# Patient Record
Sex: Male | Born: 1960
Health system: Southern US, Community
[De-identification: ages and names within clinical notes are randomized; demographics above are authoritative.]

## PROBLEM LIST (undated history)

## (undated) DIAGNOSIS — E119 Type 2 diabetes mellitus without complications: Secondary | ICD-10-CM

## (undated) DIAGNOSIS — F1020 Alcohol dependence, uncomplicated: Secondary | ICD-10-CM

## (undated) DIAGNOSIS — I10 Essential (primary) hypertension: Secondary | ICD-10-CM

## (undated) DIAGNOSIS — E785 Hyperlipidemia, unspecified: Secondary | ICD-10-CM

## (undated) HISTORY — DX: Hyperlipidemia, unspecified: E78.5

## (undated) HISTORY — PX: APPENDECTOMY: SHX54

## (undated) HISTORY — DX: Essential (primary) hypertension: I10

---

## 2014-03-14 ENCOUNTER — Encounter: Payer: Self-pay | Admitting: Family

## 2014-04-12 ENCOUNTER — Encounter: Payer: Self-pay | Admitting: Family

## 2014-08-23 ENCOUNTER — Encounter: Payer: Self-pay | Admitting: Family Medicine

## 2014-11-14 ENCOUNTER — Telehealth: Payer: Self-pay | Admitting: Family Medicine

## 2014-11-15 NOTE — Telephone Encounter (Signed)
Patient has bcbs anthem and currently is not taking any medications. Appointment scheduled for 4/7 with Jannifer Rodneyhristy Hawks, FNP and he is aware that he needs to arrive 15 minutes before appointment and to bring insurance card.

## 2014-12-25 ENCOUNTER — Telehealth: Payer: Self-pay | Admitting: Family

## 2014-12-25 NOTE — Telephone Encounter (Signed)
Detailed message left that the reason for our call was to remind patient of his appointment.

## 2014-12-28 ENCOUNTER — Ambulatory Visit (INDEPENDENT_AMBULATORY_CARE_PROVIDER_SITE_OTHER): Payer: BLUE CROSS/BLUE SHIELD | Admitting: Family

## 2014-12-28 ENCOUNTER — Encounter: Payer: Self-pay | Admitting: Family

## 2014-12-28 VITALS — BP 142/83 | HR 100 | Temp 99.0°F | Ht 66.0 in | Wt 231.2 lb

## 2014-12-28 DIAGNOSIS — Z0000 Encounter for general adult medical examination without abnormal findings: Secondary | ICD-10-CM | POA: Diagnosis not present

## 2014-12-28 DIAGNOSIS — Z1211 Encounter for screening for malignant neoplasm of colon: Secondary | ICD-10-CM

## 2014-12-28 DIAGNOSIS — I10 Essential (primary) hypertension: Secondary | ICD-10-CM

## 2014-12-28 MED ORDER — METOPROLOL SUCCINATE ER 50 MG PO TB24: 50.0000 mg | ORAL_TABLET | Freq: Every day | ORAL | Status: DC

## 2014-12-28 NOTE — Patient Instructions (Signed)
Hypertension Hypertension, commonly called high blood pressure, is when the force of blood pumping through your arteries is too strong. Your arteries are the blood vessels that carry blood from your heart throughout your body. A blood pressure reading consists of a higher number over a lower number, such as 110/72. The higher number (systolic) is the pressure inside your arteries when your heart pumps. The lower number (diastolic) is the pressure inside your arteries when your heart relaxes. Ideally you want your blood pressure below 120/80. Hypertension forces your heart to work harder to pump blood. Your arteries may become narrow or stiff. Having hypertension puts you at risk for heart disease, stroke, and other problems.  RISK FACTORS Some risk factors for high blood pressure are controllable. Others are not.  Risk factors you cannot control include:   Race. You may be at higher risk if you are African American.  Age. Risk increases with age.  Gender. Men are at higher risk than women before age 45 years. After age 65, women are at higher risk than men. Risk factors you can control include:  Not getting enough exercise or physical activity.  Being overweight.  Getting too much fat, sugar, calories, or salt in your diet.  Drinking too much alcohol. SIGNS AND SYMPTOMS Hypertension does not usually cause signs or symptoms. Extremely high blood pressure (hypertensive crisis) may cause headache, anxiety, shortness of breath, and nosebleed. DIAGNOSIS  To check if you have hypertension, your health care provider will measure your blood pressure while you are seated, with your arm held at the level of your heart. It should be measured at least twice using the same arm. Certain conditions can cause a difference in blood pressure between your right and left arms. A blood pressure reading that is higher than normal on one occasion does not mean that you need treatment. If one blood pressure reading  is high, ask your health care provider about having it checked again. TREATMENT  Treating high blood pressure includes making lifestyle changes and possibly taking medicine. Living a healthy lifestyle can help lower high blood pressure. You may need to change some of your habits. Lifestyle changes may include:  Following the DASH diet. This diet is high in fruits, vegetables, and whole grains. It is low in salt, red meat, and added sugars.  Getting at least 2 hours of brisk physical activity every week.  Losing weight if necessary.  Not smoking.  Limiting alcoholic beverages.  Learning ways to reduce stress. If lifestyle changes are not enough to get your blood pressure under control, your health care provider may prescribe medicine. You may need to take more than one. Work closely with your health care provider to understand the risks and benefits. HOME CARE INSTRUCTIONS  Have your blood pressure rechecked as directed by your health care provider.   Take medicines only as directed by your health care provider. Follow the directions carefully. Blood pressure medicines must be taken as prescribed. The medicine does not work as well when you skip doses. Skipping doses also puts you at risk for problems.   Do not smoke.   Monitor your blood pressure at home as directed by your health care provider. SEEK MEDICAL CARE IF:   You think you are having a reaction to medicines taken.  You have recurrent headaches or feel dizzy.  You have swelling in your ankles.  You have trouble with your vision. SEEK IMMEDIATE MEDICAL CARE IF:  You develop a severe headache or confusion.    You have unusual weakness, numbness, or feel faint.  You have severe chest or abdominal pain.  You vomit repeatedly.  You have trouble breathing. MAKE SURE YOU:   Understand these instructions.  Will watch your condition.  Will get help right away if you are not doing well or get worse. Document  Released: 09/08/2005 Document Revised: 01/23/2014 Document Reviewed: 07/01/2013 [employer] Patient Information 2015 [employer]. This information is not intended to replace advice given to you by your health care provider. Make sure you discuss any questions you have with your health care provider. DASH Eating Plan DASH stands for "Dietary Approaches to Stop Hypertension." The DASH eating plan is a healthy eating plan that has been shown to reduce high blood pressure (hypertension). Additional health benefits may include reducing the risk of type 2 diabetes mellitus, heart disease, and stroke. The DASH eating plan may also help with weight loss. WHAT DO I NEED TO KNOW ABOUT THE DASH EATING PLAN? For the DASH eating plan, you will follow these general guidelines:  Choose foods with a percent daily value for sodium of less than 5% (as listed on the food label).  Use salt-free seasonings or herbs instead of table salt or sea salt.  Check with your health care provider or pharmacist before using salt substitutes.  Eat lower-sodium products, often labeled as "lower sodium" or "no salt added."  Eat fresh foods.  Eat more vegetables, fruits, and low-fat dairy products.  Choose whole grains. Look for the word "whole" as the first word in the ingredient list.  Choose fish and skinless chicken or turkey more often than red meat. Limit fish, poultry, and meat to 6 oz (170 g) each day.  Limit sweets, desserts, sugars, and sugary drinks.  Choose heart-healthy fats.  Limit cheese to 1 oz (28 g) per day.  Eat more home-cooked food and less restaurant, buffet, and fast food.  Limit fried foods.  Cook foods using methods other than frying.  Limit canned vegetables. If you do use them, rinse them well to decrease the sodium.  When eating at a restaurant, ask that your food be prepared with less salt, or no salt if possible. WHAT FOODS CAN I EAT? Seek help from a dietitian for individual  calorie needs. Grains Whole grain or whole wheat bread. Brown rice. Whole grain or whole wheat pasta. Quinoa, bulgur, and whole grain cereals. Low-sodium cereals. Corn or whole wheat flour tortillas. Whole grain cornbread. Whole grain crackers. Low-sodium crackers. Vegetables Fresh or frozen vegetables (raw, steamed, roasted, or grilled). Low-sodium or reduced-sodium tomato and vegetable juices. Low-sodium or reduced-sodium tomato sauce and paste. Low-sodium or reduced-sodium canned vegetables.  Fruits All fresh, canned (in natural juice), or frozen fruits. Meat and Other Protein Products Ground beef (85% or leaner), grass-fed beef, or beef trimmed of fat. Skinless chicken or turkey. Ground chicken or turkey. Pork trimmed of fat. All fish and seafood. Eggs. Dried beans, peas, or lentils. Unsalted nuts and seeds. Unsalted canned beans. Dairy Low-fat dairy products, such as skim or 1% milk, 2% or reduced-fat cheeses, low-fat ricotta or cottage cheese, or plain low-fat yogurt. Low-sodium or reduced-sodium cheeses. Fats and Oils Tub margarines without trans fats. Light or reduced-fat mayonnaise and salad dressings (reduced sodium). Avocado. Safflower, olive, or canola oils. Natural peanut or almond butter. Other Unsalted popcorn and pretzels. The items listed above may not be a complete list of recommended foods or beverages. Contact your dietitian for more options. WHAT FOODS ARE NOT RECOMMENDED? Grains White bread.   White pasta. White rice. Refined cornbread. Bagels and croissants. Crackers that contain trans fat. Vegetables Creamed or fried vegetables. Vegetables in a cheese sauce. Regular canned vegetables. Regular canned tomato sauce and paste. Regular tomato and vegetable juices. Fruits Dried fruits. Canned fruit in light or heavy syrup. Fruit juice. Meat and Other Protein Products Fatty cuts of meat. Ribs, chicken wings, bacon, sausage, bologna, salami, chitterlings, fatback, hot dogs,  bratwurst, and packaged luncheon meats. Salted nuts and seeds. Canned beans with salt. Dairy Whole or 2% milk, cream, half-and-half, and cream cheese. Whole-fat or sweetened yogurt. Full-fat cheeses or blue cheese. Nondairy creamers and whipped toppings. Processed cheese, cheese spreads, or cheese curds. Condiments Onion and garlic salt, seasoned salt, table salt, and sea salt. Canned and packaged gravies. Worcestershire sauce. Tartar sauce. Barbecue sauce. Teriyaki sauce. Soy sauce, including reduced sodium. Steak sauce. Fish sauce. Oyster sauce. Cocktail sauce. Horseradish. Ketchup and mustard. Meat flavorings and tenderizers. Bouillon cubes. Hot sauce. Tabasco sauce. Marinades. Taco seasonings. Relishes. Fats and Oils Butter, stick margarine, lard, shortening, ghee, and bacon fat. Coconut, palm kernel, or palm oils. Regular salad dressings. Other Pickles and olives. Salted popcorn and pretzels. The items listed above may not be a complete list of foods and beverages to avoid. Contact your dietitian for more information. WHERE CAN I FIND MORE INFORMATION? National Heart, Lung, and Blood Institute: [employer] Document Released: 08/28/2011 Document Revised: 01/23/2014 Document Reviewed: 07/13/2013 [facility] Patient Information 2015 [place], [place]. This information is not intended to replace advice given to you by your health care provider. Make sure you discuss any questions you have with your health care provider. Health Maintenance A healthy lifestyle and preventative care can promote health and wellness.  Maintain regular health, dental, and eye exams.  Eat a healthy diet. Foods like vegetables, fruits, whole grains, low-fat dairy products, and lean protein foods contain the nutrients you need and are low in calories. Decrease your intake of foods high in solid fats, added sugars, and salt. Get information about a proper diet from your health care  provider, if necessary.  Regular physical exercise is one of the most important things you can do for your health. Most adults should get at least 150 minutes of moderate-intensity exercise (any activity that increases your heart rate and causes you to sweat) each week. In addition, most adults need muscle-strengthening exercises on 2 or more days a week.   Maintain a healthy weight. The body mass index (BMI) is a screening tool to identify possible weight problems. It provides an estimate of body fat based on height and weight. Your health care provider can find your BMI and can help you achieve or maintain a healthy weight. For males 20 years and older:  A BMI below 18.5 is considered underweight.  A BMI of 18.5 to 24.9 is normal.  A BMI of 25 to 29.9 is considered overweight.  A BMI of 30 and above is considered obese.  Maintain normal blood lipids and cholesterol by exercising and minimizing your intake of saturated fat. Eat a balanced diet with plenty of fruits and vegetables. Blood tests for lipids and cholesterol should begin at age 66 and be repeated every 5 years. If your lipid or cholesterol levels are high, you are over age 67, or you are at high risk for heart disease, you may need your cholesterol levels checked more frequently.Ongoing high lipid and cholesterol levels should be treated with medicines if diet and exercise are not working.  If you smoke, find out  from your health care provider how to quit. If you do not use tobacco, do not start.  Lung cancer screening is recommended for adults aged 55-80 years who are at high risk for developing lung cancer because of a history of smoking. A yearly low-dose CT scan of the lungs is recommended for people who have at least a 30-pack-year history of smoking and are current smokers or have quit within the past 15 years. A pack year of smoking is smoking an average of 1 pack of cigarettes a day for 1 year (for example, a 30-pack-year  history of smoking could mean smoking 1 pack a day for 30 years or 2 packs a day for 15 years). Yearly screening should continue until the smoker has stopped smoking for at least 15 years. Yearly screening should be stopped for people who develop a health problem that would prevent them from having lung cancer treatment.  If you choose to drink alcohol, do not have more than 2 drinks per day. One drink is considered to be 12 oz (360 mL) of beer, 5 oz (150 mL) of wine, or 1.5 oz (45 mL) of liquor.  Avoid the use of street drugs. Do not share needles with anyone. Ask for help if you need support or instructions about stopping the use of drugs.  High blood pressure causes heart disease and increases the risk of stroke. Blood pressure should be checked at least every 1-2 years. Ongoing high blood pressure should be treated with medicines if weight loss and exercise are not effective.  If you are 4545-55 years old, ask your health care provider if you should take aspirin to prevent heart disease.  Diabetes screening involves taking a blood sample to check your fasting blood sugar level. This should be done once every 3 years after age 55 if you are at a normal weight and without risk factors for diabetes. Testing should be considered at a younger age or be carried out more frequently if you are overweight and have at least 1 risk factor for diabetes.  Colorectal cancer can be detected and often prevented. Most routine colorectal cancer screening begins at the age of 55 and continues through age 55. However, your health care provider may recommend screening at an earlier age if you have risk factors for colon cancer. On a yearly basis, your health care provider may provide home test kits to check for hidden blood in the stool. A small camera at the end of a tube may be used to directly examine the colon (sigmoidoscopy or colonoscopy) to detect the earliest forms of colorectal cancer. Talk to your health care  provider about this at age 55 when routine screening begins. A direct exam of the colon should be repeated every 5-10 years through age 55, unless early forms of precancerous polyps or small growths are found.  People who are at an increased risk for hepatitis B should be screened for this virus. You are considered at high risk for hepatitis B if:  You were born in a country where hepatitis B occurs often. Talk with your health care provider about which countries are considered high risk.  Your parents were born in a high-risk country and you have not received a shot to protect against hepatitis B (hepatitis B vaccine).  You have HIV or AIDS.  You use needles to inject street drugs.  You live with, or have sex with, someone who has hepatitis B.  You are a man who has sex  with other men (MSM).  You get hemodialysis treatment.  You take certain medicines for conditions like cancer, organ transplantation, and autoimmune conditions.  Hepatitis C blood testing is recommended for all people born from [birth year] through 1966 and any individual with known risk factors for hepatitis C.  Healthy men should no longer receive prostate-specific antigen (PSA) blood tests as part of routine cancer screening. Talk to your health care provider about prostate cancer screening.  Testicular cancer screening is not recommended for adolescents or adult males who have no symptoms. Screening includes self-exam, a health care provider exam, and other screening tests. Consult with your health care provider about any symptoms you have or any concerns you have about testicular cancer.  Practice safe sex. Use condoms and avoid high-risk sexual practices to reduce the spread of sexually transmitted infections (STIs).  You should be screened for STIs, including gonorrhea and chlamydia if:  You are sexually active and are younger than 24 years.  You are older than 24 years, and your health care provider tells you that you  are at risk for this type of infection.  Your sexual activity has changed since you were last screened, and you are at an increased risk for chlamydia or gonorrhea. Ask your health care provider if you are at risk.  If you are at risk of being infected with HIV, it is recommended that you take a prescription medicine daily to prevent HIV infection. This is called pre-exposure prophylaxis (PrEP). You are considered at risk if:  You are a man who has sex with other men (MSM).  You are a heterosexual man who is sexually active with multiple partners.  You take drugs by injection.  You are sexually active with a partner who has HIV.  Talk with your health care provider about whether you are at high risk of being infected with HIV. If you choose to begin PrEP, you should first be tested for HIV. You should then be tested every 3 months for as long as you are taking PrEP.  Use sunscreen. Apply sunscreen liberally and repeatedly throughout the day. You should seek shade when your shadow is shorter than you. Protect yourself by wearing long sleeves, pants, a wide-brimmed hat, and sunglasses year round whenever you are outdoors.  Tell your health care provider of new moles or changes in moles, especially if there is a change in shape or color. Also, tell your health care provider if a mole is larger than the size of a pencil eraser.  A one-time screening for abdominal aortic aneurysm (AAA) and surgical repair of large AAAs by ultrasound is recommended for men aged 65-75 years who are current or former smokers.  Stay current with your vaccines (immunizations). Document Released: 03/06/2008 Document Revised: 09/13/2013 Document Reviewed: 02/03/2011 [facility] Patient Information 2015 [place], [place]. This information is not intended to replace advice given to you by your health care provider. Make sure you discuss any questions you have with your health care provider.

## 2014-12-28 NOTE — Progress Notes (Signed)
   Subjective:    Patient ID: Austin Rush, male    DOB: 01/07/1961, 54 y.o.   MRN: [id number]    HPI Pt presents to the office to establish care. Pt currently not taking any medications at this time. Pt denies any headache, palpitations, SOB, or edema at this time.  Pt's BP is elevated today. Pt states it usually is in the 150's/80's.   Review of Systems  Constitutional: Negative.   HENT: Negative.   Respiratory: Negative.   Cardiovascular: Negative.   Gastrointestinal: Negative.   Endocrine: Negative.   Genitourinary: Negative.   Musculoskeletal: Negative.   Neurological: Negative.   Hematological: Negative.   Psychiatric/Behavioral: Negative.   All other systems reviewed and are negative.      Objective:   Physical Exam  Constitutional: He is oriented to person, place, and time. He appears well-developed and well-nourished. No distress.  HENT:  Head: Normocephalic.  Right Ear: External ear normal.  Left Ear: External ear normal.  Nose: Nose normal.  Mouth/Throat: Oropharynx is clear and moist.  Eyes: Pupils are equal, round, and reactive to light. Right eye exhibits no discharge. Left eye exhibits no discharge.  Neck: Normal range of motion. Neck supple. No thyromegaly present.  Cardiovascular: Normal rate, regular rhythm, normal heart sounds and intact distal pulses.   No murmur heard. Pulmonary/Chest: Effort normal and breath sounds normal. No respiratory distress. He has no wheezes.  Abdominal: Soft. Bowel sounds are normal. He exhibits no distension. There is no tenderness.  Musculoskeletal: Normal range of motion. He exhibits no edema or tenderness.  Neurological: He is alert and oriented to person, place, and time. He has normal reflexes. No cranial nerve deficit.  Skin: Skin is warm and dry. No rash noted. No erythema.  Psychiatric: He has a normal mood and affect. His behavior is normal. Judgment and thought content normal.  Vitals reviewed.   BP 142/83 mmHg   Pulse 100  Temp(Src) 99 F (37.2 C) (Oral)  Ht $R'5\' 6"'fD$  (1.676 m)  Wt 231 lb 3.2 oz (104.872 kg)  BMI 37.33 kg/m2 '      Assessment & Plan:  1. Essential hypertension -I started on  Metoprolol 50 mg today -Dash diet information given -Exercise encouraged - Stress Management  -Continue current meds -RTO in 2 weeks  - CMP14+EGFR - metoprolol succinate (TOPROL-XL) 50 MG 24 hr tablet; Take 1 tablet (50 mg total) by mouth daily. Take with or immediately following a meal.  Dispense: 90 tablet; Refill: 3  2. Annual physical exam - CMP14+EGFR - PSA, total and free - Thyroid Panel With TSH - Vit D  25 hydroxy (rtn osteoporosis monitoring)  3. Encounter for screening colonoscopy - Ambulatory referral to Gastroenterology   Continue all meds Labs pending Health Maintenance reviewed Diet and exercise encouraged RTO 2 weeks   Evelina Dun, FNP

## 2014-12-29 ENCOUNTER — Telehealth: Payer: Self-pay | Admitting: *Deleted

## 2014-12-29 ENCOUNTER — Other Ambulatory Visit: Payer: Self-pay | Admitting: Family

## 2014-12-29 DIAGNOSIS — E559 Vitamin D deficiency, unspecified: Secondary | ICD-10-CM

## 2014-12-29 DIAGNOSIS — E1159 Type 2 diabetes mellitus with other circulatory complications: Secondary | ICD-10-CM | POA: Insufficient documentation

## 2014-12-29 DIAGNOSIS — I10 Essential (primary) hypertension: Secondary | ICD-10-CM

## 2014-12-29 DIAGNOSIS — I152 Hypertension secondary to endocrine disorders: Secondary | ICD-10-CM | POA: Insufficient documentation

## 2014-12-29 LAB — CMP14+EGFR
A/G RATIO: 1.5 (ref 1.1–2.5)
ALBUMIN: 4.2 g/dL (ref 3.5–5.5)
ALK PHOS: 67 IU/L (ref 39–117)
ALT: 60 IU/L — ABNORMAL HIGH (ref 0–44)
AST: 33 IU/L (ref 0–40)
BILIRUBIN TOTAL: 0.5 mg/dL (ref 0.0–1.2)
BUN / CREAT RATIO: 10 (ref 9–20)
BUN: 10 mg/dL (ref 6–24)
CO2: 22 mmol/L (ref 18–29)
Calcium: 9.9 mg/dL (ref 8.7–10.2)
Chloride: 101 mmol/L (ref 97–108)
Creatinine, Ser: 1.05 mg/dL (ref 0.76–1.27)
GFR calc Af Amer: 92 mL/min/{1.73_m2} (ref >59)
GFR calc non Af Amer: 80 mL/min/{1.73_m2} (ref >59)
GLOBULIN, TOTAL: 2.8 g/dL (ref 1.5–4.5)
Glucose: 98 mg/dL (ref 65–99)
Potassium: 4.6 mmol/L (ref 3.5–5.2)
Sodium: 139 mmol/L (ref 134–144)
Total Protein: 7 g/dL (ref 6.0–8.5)

## 2014-12-29 LAB — PSA, TOTAL AND FREE
PSA FREE: 0.31 ng/mL
PSA, Free Pct: 28.2 %
PSA: 1.1 ng/mL (ref 0.0–4.0)

## 2014-12-29 LAB — THYROID PANEL WITH TSH
FREE THYROXINE INDEX: 2 (ref 1.2–4.9)
T3 Uptake Ratio: 28 % (ref 24–39)
T4 TOTAL: 7 ug/dL (ref 4.5–12.0)
TSH: 0.472 u[IU]/mL (ref 0.450–4.500)

## 2014-12-29 LAB — VITAMIN D 25 HYDROXY (VIT D DEFICIENCY, FRACTURES): Vit D, 25-Hydroxy: 22.3 ng/mL — ABNORMAL LOW (ref 30.0–100.0)

## 2014-12-29 MED ORDER — VITAMIN D (ERGOCALCIFEROL) 1.25 MG (50000 UNIT) PO CAPS: 50000.0000 [IU] | ORAL_CAPSULE | ORAL | Status: DC

## 2014-12-29 NOTE — Telephone Encounter (Signed)
-----   Message from Junie Spencerhristy A Hawks, FNP sent at 12/29/2014  9:01 AM EDT ----- Kidney and liver function stable- Liver enzyme slightly elevated- Limit alcohol and tylenol intake Thyroid levels WNL PSA levels WNL Vit D levels low- Prescription sent to pharmacy

## 2014-12-29 NOTE — Progress Notes (Signed)
Lmtcb/ww 4/8 

## 2015-01-30 ENCOUNTER — Telehealth: Payer: Self-pay | Admitting: Family

## 2015-01-30 NOTE — Telephone Encounter (Signed)
Detailed message left that patient was due to be seen 2 weeks after last visit so he is due to be seen now.

## 2015-03-06 ENCOUNTER — Encounter (INDEPENDENT_AMBULATORY_CARE_PROVIDER_SITE_OTHER): Payer: Self-pay

## 2015-03-06 ENCOUNTER — Encounter: Payer: Self-pay | Admitting: Family

## 2015-03-06 ENCOUNTER — Ambulatory Visit (INDEPENDENT_AMBULATORY_CARE_PROVIDER_SITE_OTHER): Payer: BLUE CROSS/BLUE SHIELD | Admitting: Family

## 2015-03-06 VITALS — BP 152/91 | HR 82 | Temp 99.1°F

## 2015-03-06 DIAGNOSIS — I10 Essential (primary) hypertension: Secondary | ICD-10-CM | POA: Diagnosis not present

## 2015-03-06 MED ORDER — HYDROCHLOROTHIAZIDE 12.5 MG PO TABS: 12.5000 mg | ORAL_TABLET | Freq: Every day | ORAL | Status: DC

## 2015-03-06 NOTE — Progress Notes (Signed)
   Subjective:    Patient ID: Austin Rush, male    DOB: Nov 17, 1960, 54 y.o.   MRN: [id number]  Pt presents to the office today to recheck HTN. Pt's BP is not at goal today. Pt states the "medication was too strong and made me sleepy so I stopped taking it". Pt was placed on metoprolol 50 mg daily, but currently not taking any medication at this time.  Hypertension This is a recurrent problem. The current episode started more than 1 year ago. The problem has been waxing and waning since onset. The problem is uncontrolled. Pertinent negatives include no anxiety, headaches, palpitations, peripheral edema or shortness of breath. Risk factors for coronary artery disease include male gender and obesity. Past treatments include nothing. The current treatment provides no improvement. There is no history of kidney disease, CAD/MI, CVA, heart failure or a thyroid problem. There is no history of sleep apnea.      Review of Systems  Constitutional: Negative.   HENT: Negative.   Respiratory: Negative.  Negative for shortness of breath.   Cardiovascular: Negative.  Negative for palpitations.  Gastrointestinal: Negative.   Endocrine: Negative.   Genitourinary: Negative.   Musculoskeletal: Negative.   Neurological: Negative.  Negative for headaches.  Hematological: Negative.   Psychiatric/Behavioral: Negative.   All other systems reviewed and are negative.      Objective:   Physical Exam  Constitutional: He is oriented to person, place, and time. He appears well-developed and well-nourished. No distress.  HENT:  Head: Normocephalic.  Right Ear: External ear normal.  Left Ear: External ear normal.  Nose: Nose normal.  Mouth/Throat: Oropharynx is clear and moist.  Eyes: Pupils are equal, round, and reactive to light. Right eye exhibits no discharge. Left eye exhibits no discharge.  Neck: Normal range of motion. Neck supple. No thyromegaly present.  Cardiovascular: Normal rate, regular rhythm,  normal heart sounds and intact distal pulses.   No murmur heard. Pulmonary/Chest: Effort normal and breath sounds normal. No respiratory distress. He has no wheezes.  Abdominal: Soft. Bowel sounds are normal. He exhibits no distension. There is no tenderness.  Musculoskeletal: Normal range of motion. He exhibits no edema or tenderness.  Neurological: He is alert and oriented to person, place, and time. He has normal reflexes. No cranial nerve deficit.  Skin: Skin is warm and dry. No rash noted. No erythema.  Psychiatric: He has a normal mood and affect. His behavior is normal. Judgment and thought content normal.  Vitals reviewed.   BP 152/91 mmHg  Pulse 82  Temp(Src) 99.1 F (37.3 C) (Oral)       Assessment & Plan:  1. Essential hypertension -Pt started on HCTZ 12.5 mg today -Dash diet information given -Exercise encouraged - Stress Management  -Continue current meds -RTO in 2 weeks  - hydrochlorothiazide (HYDRODIURIL) 12.5 MG tablet; Take 1 tablet (12.5 mg total) by mouth daily.  Dispense: 90 tablet; Refill: 3 - BMP8+EGFR  Evelina Dun, FNP

## 2015-03-06 NOTE — Patient Instructions (Signed)
DASH Eating Plan DASH stands for "Dietary Approaches to Stop Hypertension." The DASH eating plan is a healthy eating plan that has been shown to reduce high blood pressure (hypertension). Additional health benefits may include reducing the risk of type 2 diabetes mellitus, heart disease, and stroke. The DASH eating plan may also help with weight loss. WHAT DO I NEED TO KNOW ABOUT THE DASH EATING PLAN? For the DASH eating plan, you will follow these general guidelines:  Choose foods with a percent daily value for sodium of less than 5% (as listed on the food label).  Use salt-free seasonings or herbs instead of table salt or sea salt.  Check with your health care provider or pharmacist before using salt substitutes.  Eat lower-sodium products, often labeled as "lower sodium" or "no salt added."  Eat fresh foods.  Eat more vegetables, fruits, and low-fat dairy products.  Choose whole grains. Look for the word "whole" as the first word in the ingredient list.  Choose fish and skinless chicken or turkey more often than red meat. Limit fish, poultry, and meat to 6 oz (170 g) each day.  Limit sweets, desserts, sugars, and sugary drinks.  Choose heart-healthy fats.  Limit cheese to 1 oz (28 g) per day.  Eat more home-cooked food and less restaurant, buffet, and fast food.  Limit fried foods.  Cook foods using methods other than frying.  Limit canned vegetables. If you do use them, rinse them well to decrease the sodium.  When eating at a restaurant, ask that your food be prepared with less salt, or no salt if possible. WHAT FOODS CAN I EAT? Seek help from a dietitian for individual calorie needs. Grains Whole grain or whole wheat bread. Brown rice. Whole grain or whole wheat pasta. Quinoa, bulgur, and whole grain cereals. Low-sodium cereals. Corn or whole wheat flour tortillas. Whole grain cornbread. Whole grain crackers. Low-sodium crackers. Vegetables Fresh or frozen vegetables  (raw, steamed, roasted, or grilled). Low-sodium or reduced-sodium tomato and vegetable juices. Low-sodium or reduced-sodium tomato sauce and paste. Low-sodium or reduced-sodium canned vegetables.  Fruits All fresh, canned (in natural juice), or frozen fruits. Meat and Other Protein Products Ground beef (85% or leaner), grass-fed beef, or beef trimmed of fat. Skinless chicken or turkey. Ground chicken or turkey. Pork trimmed of fat. All fish and seafood. Eggs. Dried beans, peas, or lentils. Unsalted nuts and seeds. Unsalted canned beans. Dairy Low-fat dairy products, such as skim or 1% milk, 2% or reduced-fat cheeses, low-fat ricotta or cottage cheese, or plain low-fat yogurt. Low-sodium or reduced-sodium cheeses. Fats and Oils Tub margarines without trans fats. Light or reduced-fat mayonnaise and salad dressings (reduced sodium). Avocado. Safflower, olive, or canola oils. Natural peanut or almond butter. Other Unsalted popcorn and pretzels. The items listed above may not be a complete list of recommended foods or beverages. Contact your dietitian for more options. WHAT FOODS ARE NOT RECOMMENDED? Grains White bread. White pasta. White rice. Refined cornbread. Bagels and croissants. Crackers that contain trans fat. Vegetables Creamed or fried vegetables. Vegetables in a cheese sauce. Regular canned vegetables. Regular canned tomato sauce and paste. Regular tomato and vegetable juices. Fruits Dried fruits. Canned fruit in light or heavy syrup. Fruit juice. Meat and Other Protein Products Fatty cuts of meat. Ribs, chicken wings, bacon, sausage, bologna, salami, chitterlings, fatback, hot dogs, bratwurst, and packaged luncheon meats. Salted nuts and seeds. Canned beans with salt. Dairy Whole or 2% milk, cream, half-and-half, and cream cheese. Whole-fat or sweetened yogurt. Full-fat   cheeses or blue cheese. Nondairy creamers and whipped toppings. Processed cheese, cheese spreads, or cheese  curds. Condiments Onion and garlic salt, seasoned salt, table salt, and sea salt. Canned and packaged gravies. Worcestershire sauce. Tartar sauce. Barbecue sauce. Teriyaki sauce. Soy sauce, including reduced sodium. Steak sauce. Fish sauce. Oyster sauce. Cocktail sauce. Horseradish. Ketchup and mustard. Meat flavorings and tenderizers. Bouillon cubes. Hot sauce. Tabasco sauce. Marinades. Taco seasonings. Relishes. Fats and Oils Butter, stick margarine, lard, shortening, ghee, and bacon fat. Coconut, palm kernel, or palm oils. Regular salad dressings. Other Pickles and olives. Salted popcorn and pretzels. The items listed above may not be a complete list of foods and beverages to avoid. Contact your dietitian for more information. WHERE CAN I FIND MORE INFORMATION? National Heart, Lung, and Blood Institute: www.nhlbi.nih.gov/health/health-topics/topics/dash/ Document Released: 08/28/2011 Document Revised: 01/23/2014 Document Reviewed: 07/13/2013 ExitCare Patient Information 2015 [employer]. This information is not intended to replace advice given to you by your health care provider. Make sure you discuss any questions you have with your health care provider. Hypertension Hypertension, commonly called high blood pressure, is when the force of blood pumping through your arteries is too strong. Your arteries are the blood vessels that carry blood from your heart throughout your body. A blood pressure reading consists of a higher number over a lower number, such as 110/72. The higher number (systolic) is the pressure inside your arteries when your heart pumps. The lower number (diastolic) is the pressure inside your arteries when your heart relaxes. Ideally you want your blood pressure below 120/80. Hypertension forces your heart to work harder to pump blood. Your arteries may become narrow or stiff. Having hypertension puts you at risk for heart disease, stroke, and other problems.  RISK  FACTORS Some risk factors for high blood pressure are controllable. Others are not.  Risk factors you cannot control include:   Race. You may be at higher risk if you are African American.  Age. Risk increases with age.  Gender. Men are at higher risk than women before age 45 years. After age 65, women are at higher risk than men. Risk factors you can control include:  Not getting enough exercise or physical activity.  Being overweight.  Getting too much fat, sugar, calories, or salt in your diet.  Drinking too much alcohol. SIGNS AND SYMPTOMS Hypertension does not usually cause signs or symptoms. Extremely high blood pressure (hypertensive crisis) may cause headache, anxiety, shortness of breath, and nosebleed. DIAGNOSIS  To check if you have hypertension, your health care provider will measure your blood pressure while you are seated, with your arm held at the level of your heart. It should be measured at least twice using the same arm. Certain conditions can cause a difference in blood pressure between your right and left arms. A blood pressure reading that is higher than normal on one occasion does not mean that you need treatment. If one blood pressure reading is high, ask your health care provider about having it checked again. TREATMENT  Treating high blood pressure includes making lifestyle changes and possibly taking medicine. Living a healthy lifestyle can help lower high blood pressure. You may need to change some of your habits. Lifestyle changes may include:  Following the DASH diet. This diet is high in fruits, vegetables, and whole grains. It is low in salt, red meat, and added sugars.  Getting at least 2 hours of brisk physical activity every week.  Losing weight if necessary.  Not smoking.  Limiting   alcoholic beverages.  Learning ways to reduce stress. If lifestyle changes are not enough to get your blood pressure under control, your health care provider may  prescribe medicine. You may need to take more than one. Work closely with your health care provider to understand the risks and benefits. HOME CARE INSTRUCTIONS  Have your blood pressure rechecked as directed by your health care provider.   Take medicines only as directed by your health care provider. Follow the directions carefully. Blood pressure medicines must be taken as prescribed. The medicine does not work as well when you skip doses. Skipping doses also puts you at risk for problems.   Do not smoke.   Monitor your blood pressure at home as directed by your health care provider. SEEK MEDICAL CARE IF:   You think you are having a reaction to medicines taken.  You have recurrent headaches or feel dizzy.  You have swelling in your ankles.  You have trouble with your vision. SEEK IMMEDIATE MEDICAL CARE IF:  You develop a severe headache or confusion.  You have unusual weakness, numbness, or feel faint.  You have severe chest or abdominal pain.  You vomit repeatedly.  You have trouble breathing. MAKE SURE YOU:   Understand these instructions.  Will watch your condition.  Will get help right away if you are not doing well or get worse. Document Released: 09/08/2005 Document Revised: 01/23/2014 Document Reviewed: 07/01/2013 ExitCare Patient Information 2015 [employer]. This information is not intended to replace advice given to you by your health care provider. Make sure you discuss any questions you have with your health care provider.  

## 2015-03-06 NOTE — Addendum Note (Signed)
Addended by: Prescott Gum on: 03/06/2015 08:43 AM   Modules accepted: Kipp Brood

## 2015-03-07 ENCOUNTER — Telehealth: Payer: Self-pay | Admitting: *Deleted

## 2015-03-07 LAB — BMP8+EGFR
BUN / CREAT RATIO: 12 (ref 9–20)
BUN: 11 mg/dL (ref 6–24)
CALCIUM: 9.9 mg/dL (ref 8.7–10.2)
CO2: 22 mmol/L (ref 18–29)
CREATININE: 0.89 mg/dL (ref 0.76–1.27)
Chloride: 105 mmol/L (ref 97–108)
GFR calc Af Amer: 111 mL/min/{1.73_m2} (ref >59)
GFR, EST NON AFRICAN AMERICAN: 96 mL/min/{1.73_m2} (ref >59)
Glucose: 122 mg/dL — ABNORMAL HIGH (ref 65–99)
Potassium: 4.4 mmol/L (ref 3.5–5.2)
SODIUM: 144 mmol/L (ref 134–144)

## 2015-03-07 NOTE — Telephone Encounter (Signed)
Pt notified of results Verbalizes understanding 

## 2015-03-07 NOTE — Telephone Encounter (Signed)
-----   Message from Junie Spencer, FNP sent at 03/07/2015  8:12 AM EDT ----- Kidney function stable

## 2015-03-09 ENCOUNTER — Telehealth: Payer: Self-pay | Admitting: *Deleted

## 2015-03-09 NOTE — Telephone Encounter (Signed)
Patient notified of lab results

## 2015-07-11 ENCOUNTER — Telehealth: Payer: Self-pay | Admitting: Family

## 2015-10-18 ENCOUNTER — Encounter: Payer: Self-pay | Admitting: Family Medicine

## 2015-10-18 ENCOUNTER — Ambulatory Visit (INDEPENDENT_AMBULATORY_CARE_PROVIDER_SITE_OTHER): Payer: BLUE CROSS/BLUE SHIELD | Admitting: Family Medicine

## 2015-10-18 VITALS — BP 156/73 | HR 80 | Temp 97.9°F | Ht 66.0 in | Wt 245.0 lb

## 2015-10-18 DIAGNOSIS — I10 Essential (primary) hypertension: Secondary | ICD-10-CM

## 2015-10-18 DIAGNOSIS — G4730 Sleep apnea, unspecified: Secondary | ICD-10-CM

## 2015-10-18 MED ORDER — HYDROCHLOROTHIAZIDE 12.5 MG PO TABS: 12.5000 mg | ORAL_TABLET | Freq: Every day | ORAL | Status: DC

## 2015-10-18 NOTE — Progress Notes (Signed)
BP 156/73 mmHg  Pulse 80  Temp(Src) 97.9 F (36.6 C) (Oral)  Ht 5' 6" (1.676 m)  Wt 245 lb (111.131 kg)  BMI 39.56 kg/m2   Subjective:    Patient ID: Austin Rush, male    DOB: 03-15-1961, 55 y.o.   MRN: [id number]  HPI: Austin Rush is a 55 y.o. male presenting on 10/18/2015 for Snoring   HPI Snoring and sleep apnea Patient comes in today because he is having issues with snoring and stopping breathing while sleeping. His fiance is holding that he has had this for quite a few years but is coming in today finally because she is pushed him to come in. He also has a sister who has sleep apnea. He denies any hypersomnia or headaches when he wakes up. He does admit that he can fall asleep very quickly and a drop of a hat. He denies any difficulty breathing during the day. He does have dry mouth when he wakes up because he is a mouth breather while he sleeps.  Hypertension Patient has elevated blood pressure and his had a diagnosis of hypertension but has been off medications for about 5 months because of not coming in and ran out. He denies any headaches, chest pain, vision troubles, or focal numbness or weakness.  Relevant past medical, surgical, family and social history reviewed and updated as indicated. Interim medical history since our last visit reviewed. Allergies and medications reviewed and updated.  Review of Systems  Constitutional: Negative for fever and fatigue.  HENT: Negative for ear discharge and ear pain.   Eyes: Negative for photophobia, discharge and visual disturbance.  Respiratory: Negative for shortness of breath and wheezing.   Cardiovascular: Negative for chest pain and leg swelling.  Gastrointestinal: Negative for abdominal pain, diarrhea and constipation.  Genitourinary: Negative for difficulty urinating.  Musculoskeletal: Negative for back pain and gait problem.  Skin: Negative for rash.  Neurological: Negative for dizziness, syncope, light-headedness and  headaches.  Psychiatric/Behavioral: Negative for decreased concentration.  All other systems reviewed and are negative.   Per HPI unless specifically indicated above     Medication List       This list is accurate as of: 10/18/15  5:18 PM.  Always use your most recent med list.               hydrochlorothiazide 12.5 MG tablet  Commonly known as:  HYDRODIURIL  Take 1 tablet (12.5 mg total) by mouth daily.           Objective:    BP 156/73 mmHg  Pulse 80  Temp(Src) 97.9 F (36.6 C) (Oral)  Ht 5' 6" (1.676 m)  Wt 245 lb (111.131 kg)  BMI 39.56 kg/m2  Wt Readings from Last 3 Encounters:  10/18/15 245 lb (111.131 kg)  12/28/14 231 lb 3.2 oz (104.872 kg)    Physical Exam  Constitutional: He is oriented to person, place, and time. He appears well-developed and well-nourished. No distress.  HENT:  Right Ear: Tympanic membrane normal.  Left Ear: Tympanic membrane normal.  Nose: Nose normal.  Mouth/Throat: Uvula is midline, oropharynx is clear and moist and mucous membranes are normal.  Mallampati score of 3  Eyes: Conjunctivae and EOM are normal. Pupils are equal, round, and reactive to light. Right eye exhibits no discharge. No scleral icterus.  Neck: Neck supple. No thyromegaly present.  Cardiovascular: Normal rate, regular rhythm, normal heart sounds and intact distal pulses.   No murmur heard. Pulmonary/Chest: Effort normal and  breath sounds normal. No respiratory distress. He has no wheezes.  Musculoskeletal: Normal range of motion. He exhibits no edema.  Lymphadenopathy:    He has no cervical adenopathy.  Neurological: He is alert and oriented to person, place, and time. Coordination normal.  Skin: Skin is warm and dry. No rash noted. He is not diaphoretic.  Psychiatric: He has a normal mood and affect. His behavior is normal.  Vitals reviewed.   Results for orders placed or performed in visit on 03/06/15  BMP8+EGFR  Result Value Ref Range   Glucose 122 (H)  65 - 99 mg/dL   BUN 11 6 - 24 mg/dL   Creatinine, Ser 0.89 0.76 - 1.27 mg/dL   GFR calc non Af Amer 96 >59 mL/min/1.73   GFR calc Af Amer 111 >59 mL/min/1.73   BUN/Creatinine Ratio 12 9 - 20   Sodium 144 134 - 144 mmol/L   Potassium 4.4 3.5 - 5.2 mmol/L   Chloride 105 97 - 108 mmol/L   CO2 22 18 - 29 mmol/L   Calcium 9.9 8.7 - 10.2 mg/dL      Assessment & Plan:   Problem List Items Addressed This Visit      Cardiovascular and Mediastinum   Essential hypertension - Primary   Relevant Medications   hydrochlorothiazide (HYDRODIURIL) 12.5 MG tablet    Other Visit Diagnoses    Sleep apnea        Patient's girlfriend told him that he stops breathing while he sleeps and snores loud. Send for sleep study    Relevant Orders    Ambulatory referral to Sleep Studies        Follow up plan: Return in about 4 weeks (around 11/15/2015), or if symptoms worsen or fail to improve, for Hypertension and labs.  Counseling provided for all of the vaccine components Orders Placed This Encounter  Procedures  . Ambulatory referral to Sleep Studies    Joshua Dettinger, MD [facility] 10/18/2015, 5:18 PM      

## 2015-11-09 ENCOUNTER — Ambulatory Visit (INDEPENDENT_AMBULATORY_CARE_PROVIDER_SITE_OTHER): Payer: BLUE CROSS/BLUE SHIELD | Admitting: Family Medicine

## 2015-11-09 ENCOUNTER — Encounter: Payer: Self-pay | Admitting: Family Medicine

## 2015-11-09 VITALS — BP 164/87 | HR 92 | Temp 98.1°F | Ht 66.0 in | Wt 246.0 lb

## 2015-11-09 DIAGNOSIS — G4730 Sleep apnea, unspecified: Secondary | ICD-10-CM

## 2015-11-09 DIAGNOSIS — Z0000 Encounter for general adult medical examination without abnormal findings: Secondary | ICD-10-CM | POA: Diagnosis not present

## 2015-11-09 DIAGNOSIS — Z1159 Encounter for screening for other viral diseases: Secondary | ICD-10-CM

## 2015-11-09 DIAGNOSIS — Z125 Encounter for screening for malignant neoplasm of prostate: Secondary | ICD-10-CM

## 2015-11-09 DIAGNOSIS — I10 Essential (primary) hypertension: Secondary | ICD-10-CM

## 2015-11-09 DIAGNOSIS — Z1211 Encounter for screening for malignant neoplasm of colon: Secondary | ICD-10-CM | POA: Diagnosis not present

## 2015-11-09 DIAGNOSIS — E669 Obesity, unspecified: Secondary | ICD-10-CM | POA: Insufficient documentation

## 2015-11-09 NOTE — Progress Notes (Signed)
BP 164/87 mmHg  Pulse 92  Temp(Src) 98.1 F (36.7 C) (Oral)  Ht _0  (1.676 m)  Wt 246 lb (111.585 kg)  BMI 39.72 kg/m2   Subjective:    Patient ID: Austin Rush, male    DOB: Mar 14, 1961, 55 y.o.   MRN: [id number]  HPI: Tajuan Dufault is a 55 y.o. male presenting on 11/09/2015 for Annual Exam; Labwork; and Snoring   HPI Adult well exam Patient is coming in today for his annual well exam. He is also due for labs. He denies any chest pain, shortness of breath, headaches or vision issues, abdominal complaints, diarrhea, nausea, vomiting, or joint issues.   Hypertension Patient comes in today for recheck of his blood pressure. His blood pressure is 164/87. He says he has stopped his hydrochlorothiazide 12.5 mg of the past couple days because this is been making him urinate over the nighttime. He has been taking in the evening before he goes to bed.  Patient denies headaches, blurred vision, chest pains, shortness of breath, or weakness.   Sleep apnea symptoms  Patient says is been having sleep apnea symptoms that his girlfriend is started noticing that they're getting worse over the past few months. He has both snoring and gasping for air during his sleep. He does not wake up from these episodes so he does not know their helping but his girlfriend has been telling them they happen quite often. He is also gaining weight more recently in she has noticed that is worse since then.   Relevant past medical, surgical, family and social history reviewed and updated as indicated. Interim medical history since our last visit reviewed. Allergies and medications reviewed and updated.  Review of Systems  Constitutional: Negative for fever and chills.  HENT: Negative for congestion, ear discharge and ear pain.   Eyes: Negative for discharge and visual disturbance.  Respiratory: Negative for cough, shortness of breath and wheezing.   Cardiovascular: Negative for chest pain, palpitations and leg  swelling.  Gastrointestinal: Negative for abdominal pain, diarrhea and constipation.  Genitourinary: Negative for difficulty urinating.  Musculoskeletal: Negative for back pain and gait problem.  Skin: Negative for rash.  Neurological: Negative for dizziness, syncope, light-headedness and headaches.  Psychiatric/Behavioral: Positive for sleep disturbance.  All other systems reviewed and are negative.   Per HPI unless specifically indicated above     Medication List       This list is accurate as of: 11/09/15  4:50 PM.  Always use your most recent med list.               hydrochlorothiazide 12.5 MG tablet  Commonly known as:  HYDRODIURIL  Take 1 tablet (12.5 mg total) by mouth daily.           Objective:    BP 164/87 mmHg  Pulse 92  Temp(Src) 98.1 F (36.7 C) (Oral)  Ht _1  (1.676 m)  Wt 246 lb (111.585 kg)  BMI 39.72 kg/m2  Wt Readings from Last 3 Encounters:  11/09/15 246 lb (111.585 kg)  10/18/15 245 lb (111.131 kg)  12/28/14 231 lb 3.2 oz (104.872 kg)    Physical Exam  Constitutional: He is oriented to person, place, and time. He appears well-developed and well-nourished. No distress.  HENT:  Mouth/Throat: Uvula is midline, oropharynx is clear and moist and mucous membranes are normal.  Mallampati score 3  Eyes: Conjunctivae and EOM are normal. Pupils are equal, round, and reactive to light. Right eye exhibits no discharge. No  scleral icterus.  Neck: Neck supple. No thyromegaly present.  Cardiovascular: Normal rate, regular rhythm, normal heart sounds and intact distal pulses.   No murmur heard. Pulmonary/Chest: Effort normal and breath sounds normal. No respiratory distress. He has no wheezes.  Musculoskeletal: Normal range of motion. He exhibits no edema.  Lymphadenopathy:    He has no cervical adenopathy.  Neurological: He is alert and oriented to person, place, and time. Coordination normal.  Skin: Skin is warm and dry. No rash noted. He is not  diaphoretic.  Psychiatric: He has a normal mood and affect. His behavior is normal.  Vitals reviewed.   Results for orders placed or performed in visit on 03/06/15  BMP8+EGFR  Result Value Ref Range   Glucose 122 (H) 65 - 99 mg/dL   BUN 11 6 - 24 mg/dL   Creatinine, Ser 0.89 0.76 - 1.27 mg/dL   GFR calc non Af Amer 96 >59 mL/min/1.73   GFR calc Af Amer 111 >59 mL/min/1.73   BUN/Creatinine Ratio 12 9 - 20   Sodium 144 134 - 144 mmol/L   Potassium 4.4 3.5 - 5.2 mmol/L   Chloride 105 97 - 108 mmol/L   CO2 22 18 - 29 mmol/L   Calcium 9.9 8.7 - 10.2 mg/dL      Assessment & Plan:   Problem List Items Addressed This Visit      Cardiovascular and Mediastinum   Essential hypertension   Relevant Orders   CMP14+EGFR     Other   Obesity (BMI 35.0-39.9 without comorbidity) (HCC)   Relevant Orders   Lipid panel   TSH    Other Visit Diagnoses    Well adult exam    -  Primary    Special screening for malignant neoplasms, colon        Relevant Orders    Ambulatory referral to Gastroenterology    Sleep apnea        Relevant Orders    Ambulatory referral to Sleep Studies    Need for hepatitis C screening test        Relevant Orders    Hepatitis C antibody    Prostate cancer screening        Relevant Orders    PSA, total and free        Follow up plan: Return in about 4 weeks (around 12/07/2015), or if symptoms worsen or fail to improve, for htn recheck.  Counseling provided for all of the vaccine components Orders Placed This Encounter  Procedures  . CMP14+EGFR  . Lipid panel  . TSH  . Hepatitis C antibody  . PSA, total and free  . Ambulatory referral to Gastroenterology  . Ambulatory referral to Sleep Studies    Caryl Pina, MD [facility] 11/09/2015, 4:50 PM

## 2015-11-21 ENCOUNTER — Telehealth: Payer: Self-pay | Admitting: Family Medicine

## 2015-12-03 ENCOUNTER — Telehealth: Payer: Self-pay

## 2015-12-03 NOTE — Telephone Encounter (Signed)
PT had called to schedule colonoscopy. He said he has a BM every other day and has to strain, and sometimes sees some blood.  OV scheduled with Wynne DustEric Gill, NP on 12/31/2015 at 1:30 PM.

## 2015-12-31 ENCOUNTER — Ambulatory Visit: Payer: Self-pay | Admitting: Nurse Practitioner

## 2016-01-23 ENCOUNTER — Ambulatory Visit (INDEPENDENT_AMBULATORY_CARE_PROVIDER_SITE_OTHER): Payer: BLUE CROSS/BLUE SHIELD | Admitting: Nurse Practitioner

## 2016-01-23 ENCOUNTER — Encounter: Payer: Self-pay | Admitting: Nurse Practitioner

## 2016-01-23 ENCOUNTER — Other Ambulatory Visit: Payer: Self-pay

## 2016-01-23 VITALS — BP 175/85 | HR 85 | Temp 98.4°F | Ht 66.0 in | Wt 238.8 lb

## 2016-01-23 DIAGNOSIS — K5900 Constipation, unspecified: Secondary | ICD-10-CM | POA: Diagnosis not present

## 2016-01-23 DIAGNOSIS — Z1211 Encounter for screening for malignant neoplasm of colon: Secondary | ICD-10-CM | POA: Diagnosis not present

## 2016-01-23 MED ORDER — PEG 3350-KCL-NA BICARB-NACL 420 G PO SOLR: 4000.0000 mL | Freq: Once | ORAL | Status: DC

## 2016-01-23 NOTE — Progress Notes (Signed)
    Primary Care Physician:  Jannifer Rodneyhristy Hawks, FNP Primary Gastroenterologist:  Dr. Darrick PennaFields  Chief Complaint  Patient presents with  . Colonoscopy    HPI:   Austin Rush is a 55 y.o. male who presents to schedule a colonoscopy. Phone triage was attempted and deferred to office visit due to patient describing straining/constipation and occasional bleeding. No history of colonoscopy found in our system.  Today he states he has intermittent straining, worse when he eats starch. Improves when he eats fruits and vegetables. Also with scant toilet tissue hematochezia when he is having worsening constipation. Denies abdominal pain, N/V, overt hematochezia, melena, fever, chills, unintentional weight loss, bowel habit changes. Denies chest pain, dyspnea, dizziness, lightheadedness, syncope, near syncope. Denies any other upper or lower GI symptoms.  His blood pressure is a bit high today, but he states he has not taken his blood pressure medication that today. Advised him to do so when he gets home.  Past Medical History  Diagnosis Date  . Hypertension     Past Surgical History  Procedure Laterality Date  . Appendectomy      Current Outpatient Prescriptions  Medication Sig Dispense Refill  . hydrochlorothiazide (HYDRODIURIL) 12.5 MG tablet Take 1 tablet (12.5 mg total) by mouth daily. 30 tablet 1  . Vitamin D, Ergocalciferol, (DRISDOL) 50000 units CAPS capsule Take 50,000 Units by mouth every 7 (seven) days.     No current facility-administered medications for this visit.    Allergies as of 01/23/2016  . (No Known Allergies)    Family History  Problem Relation Age of Onset  . Diabetes Mother   . Cancer Father     PROSTATE    Social History   Social History  . Marital Status: Single    Spouse Name: N/A  . Number of Children: N/A  . Years of Education: N/A   Occupational History  . Not on file.   Social History Main Topics  . Smoking status: Former Smoker    Quit date:  02/21/2011  . Smokeless tobacco: Not on file  . Alcohol Use: 0.0 oz/week    0 Standard drinks or equivalent per week     Comment: SELDOM  . Drug Use: No  . Sexual Activity: Not on file   Other Topics Concern  . Not on file   Social History Narrative    Review of Systems: 10-point ROS negative except as per HPI.    Physical Exam: BP 175/85 mmHg  Pulse 85  Temp(Src) 98.4 F (36.9 C) (Oral)  Ht 5\' 6"  (1.676 m)  Wt 238 lb 12.8 oz (108.319 kg)  BMI 38.56 kg/m2 General:   Alert and oriented. Pleasant and cooperative. Well-nourished and well-developed.  Head:  Normocephalic and atraumatic. Eyes:  Without icterus, sclera clear and conjunctiva pink.  Ears:  Normal auditory acuity. Cardiovascular:  S1, S2 present without murmurs appreciated. Extremities without clubbing or edema. Respiratory:  Clear to auscultation bilaterally. No wheezes, rales, or rhonchi. No distress.  Gastrointestinal:  +BS, rounded but soft, non-tender and non-distended. No HSM noted. No guarding or rebound. No masses appreciated.  Rectal:  Deferred  Musculoskalatal:  Symmetrical without gross deformities. Neurologic:  Alert and oriented x4;  grossly normal neurologically. Psych:  Alert and cooperative. Normal mood and affect. Heme/Lymph/Immune: No excessive bruising noted.    01/23/2016 2:11 PM   Disclaimer: This note was dictated with voice recognition software. Similar sounding words can inadvertently be transcribed and may not be corrected upon review.

## 2016-01-23 NOTE — Assessment & Plan Note (Signed)
Currently due for initial screening colonoscopy. Generally asymptomatic from a GI standpoint except for intermittent constipation which improved significantly when he eats fiber rich foods. At this point we'll proceed with colonoscopy. Return for follow-up based on postprocedure recommendations.  Proceed with colonoscopy with Dr. Darrick PennaFields in the near future. The risks, benefits, and alternatives have been discussed in detail with the patient. They state understanding and desire to proceed.   The patient is not on any anticoagulants, anxiolytics, chronic pain medications, or antidepressants. Conscious sedation should be adequate for his procedure.

## 2016-01-23 NOTE — Assessment & Plan Note (Signed)
Intermittent constipation which is worse when he eats a meal of high starch foods. It improved significantly when he eats fruits and vegetables. Recommend he incorporate more water and fibrous foods into his diet. Return for follow-up based on postprocedure recommendations after colonoscopy as noted below.

## 2016-01-23 NOTE — Patient Instructions (Signed)
1. We will schedule your procedure for you. 2. Take her blood pressure medications when he at home. 3. Return for follow-up based on postprocedure recommendations.

## 2016-01-24 NOTE — Progress Notes (Signed)
cc'ed to pcp °

## 2016-02-22 ENCOUNTER — Encounter (HOSPITAL_COMMUNITY): Payer: Self-pay | Admitting: *Deleted

## 2016-02-22 ENCOUNTER — Ambulatory Visit (HOSPITAL_COMMUNITY)
Admission: RE | Admit: 2016-02-22 | Discharge: 2016-02-22 | Disposition: A | Discharge status: Home / Self Care | Payer: BLUE CROSS/BLUE SHIELD | Source: Ambulatory Visit | Attending: Gastroenterology | Admitting: Gastroenterology

## 2016-02-22 ENCOUNTER — Encounter (HOSPITAL_COMMUNITY)
Admission: RE | Disposition: A | Discharge status: Home / Self Care | Payer: Self-pay | Source: Ambulatory Visit | Attending: Gastroenterology

## 2016-02-22 DIAGNOSIS — K621 Rectal polyp: Secondary | ICD-10-CM | POA: Diagnosis not present

## 2016-02-22 DIAGNOSIS — Z8042 Family history of malignant neoplasm of prostate: Secondary | ICD-10-CM | POA: Insufficient documentation

## 2016-02-22 DIAGNOSIS — K648 Other hemorrhoids: Secondary | ICD-10-CM | POA: Diagnosis not present

## 2016-02-22 DIAGNOSIS — K5730 Diverticulosis of large intestine without perforation or abscess without bleeding: Secondary | ICD-10-CM | POA: Diagnosis not present

## 2016-02-22 DIAGNOSIS — Z1211 Encounter for screening for malignant neoplasm of colon: Secondary | ICD-10-CM | POA: Diagnosis not present

## 2016-02-22 DIAGNOSIS — Z79899 Other long term (current) drug therapy: Secondary | ICD-10-CM | POA: Insufficient documentation

## 2016-02-22 DIAGNOSIS — I10 Essential (primary) hypertension: Secondary | ICD-10-CM | POA: Insufficient documentation

## 2016-02-22 HISTORY — PX: COLONOSCOPY: SHX5424

## 2016-02-22 SURGERY — COLONOSCOPY
Anesthesia: Moderate Sedation

## 2016-02-22 MED ORDER — SODIUM CHLORIDE 0.9 % IV SOLN
INTRAVENOUS | Status: DC
  Administered 2016-02-22: 10:00:00 via INTRAVENOUS

## 2016-02-22 MED ORDER — MIDAZOLAM HCL 5 MG/5ML IJ SOLN
INTRAMUSCULAR | Status: DC | PRN
Start: 2016-02-22 — End: 2016-02-22
  Administered 2016-02-22 (×4): 2 mg via INTRAVENOUS

## 2016-02-22 MED ORDER — MEPERIDINE HCL 100 MG/ML IJ SOLN
INTRAMUSCULAR | Status: AC
  Filled 2016-02-22: qty 2

## 2016-02-22 MED ORDER — MEPERIDINE HCL 100 MG/ML IJ SOLN
INTRAMUSCULAR | Status: DC | PRN
  Administered 2016-02-22 (×4): 25 mg via INTRAVENOUS

## 2016-02-22 MED ORDER — MIDAZOLAM HCL 5 MG/5ML IJ SOLN
INTRAMUSCULAR | Status: AC
  Filled 2016-02-22: qty 10

## 2016-02-22 MED ORDER — SIMETHICONE 40 MG/0.6ML PO SUSP
ORAL | Status: DC | PRN
  Administered 2016-02-22: 2.5 mL

## 2016-02-22 NOTE — Discharge Instructions (Signed)
You have  internal hemorrhoids and diverticulosis IN YOUR RIGHT COLON. YOU HAD TWO SMALL POLYPS REMOVED FROM YOUR RECTUM.   DRINK WATER TO KEEP YOUR URINE LIGHT YELLOW.  CONTINUE YOUR WEIGHT LOSS EFFORTS. LOSE 10 LBS. YOUR BODY MASS INDEX IS OVER 30 WHICH MEANS YOU ARE OBESE. OBESITY IS ASSOCIATED WITH AN INCREASE FOR ALL CANCERS, INCLUDING ESOPHAGEAL AND COLON CANCER.  FOLLOW A HIGH FIBER DIET. AVOID ITEMS THAT CAUSE BLOATING. See info below.  YOUR BIOPSY RESULTS WILL BE AVAILABLE IN MY CHART JUN 6 AND MY OFFICE WILL CONTACT YOU IN 10-14 DAYS WITH YOUR RESULTS.   Next colonoscopy in 5-10 years.  Colonoscopy Care After Read the instructions outlined below and refer to this sheet in the next week. These discharge instructions provide you with general information on caring for yourself after you leave the hospital. While your treatment has been planned according to the most current medical practices available, unavoidable complications occasionally occur. If you have any problems or questions after discharge, call DR. Maela Takeda, [phone].  ACTIVITY  You may resume your regular activity, but move at a slower pace for the next 24 hours.   Take frequent rest periods for the next 24 hours.   Walking will help get rid of the air and reduce the bloated feeling in your belly (abdomen).   No driving for 24 hours (because of the medicine (anesthesia) used during the test).   You may shower.   Do not sign any important legal documents or operate any machinery for 24 hours (because of the anesthesia used during the test).    NUTRITION  Drink plenty of fluids.   You may resume your normal diet as instructed by your doctor.   Begin with a light meal and progress to your normal diet. Heavy or fried foods are harder to digest and may make you feel sick to your stomach (nauseated).   Avoid alcoholic beverages for 24 hours or as instructed.    MEDICATIONS  You may resume your normal  medications.   WHAT YOU CAN EXPECT TODAY  Some feelings of bloating in the abdomen.   Passage of more gas than usual.   Spotting of blood in your stool or on the toilet paper  .  IF YOU HAD POLYPS REMOVED DURING THE COLONOSCOPY:  Eat a soft diet IF YOU HAVE NAUSEA, BLOATING, ABDOMINAL PAIN, OR VOMITING.    FINDING OUT THE RESULTS OF YOUR TEST Not all test results are available during your visit. DR. Darrick Penna WILL CALL YOU WITHIN 14 DAYS OF YOUR PROCEDUE WITH YOUR RESULTS. Do not assume everything is normal if you have not heard from DR. Matsue Strom, CALL HER OFFICE AT [phone].  SEEK IMMEDIATE MEDICAL ATTENTION AND CALL THE OFFICE: [phone] IF:  You have more than a spotting of blood in your stool.   Your belly is swollen (abdominal distention).   You are nauseated or vomiting.   You have a temperature over 101F.   You have abdominal pain or discomfort that is severe or gets worse throughout the day.  High-Fiber Diet A high-fiber diet changes your normal diet to include more whole grains, legumes, fruits, and vegetables. Changes in the diet involve replacing refined carbohydrates with unrefined foods. The calorie level of the diet is essentially unchanged. The Dietary Reference Intake (recommended amount) for adult males is 38 grams per day. For adult females, it is 25 grams per day. Pregnant and lactating women should consume 28 grams of fiber per day. Fiber is the  intact part of a plant that is not broken down during digestion. Functional fiber is fiber that has been isolated from the plant to provide a beneficial effect in the body. PURPOSE  Increase stool bulk.   Ease and regulate bowel movements.   Lower cholesterol.  REDUCE RISK OF COLON CANCER  INDICATIONS THAT YOU NEED MORE FIBER  Constipation and hemorrhoids.   Uncomplicated diverticulosis (intestine condition) and irritable bowel syndrome.   Weight management.   As a protective measure against hardening  of the arteries (atherosclerosis), diabetes, and cancer.   GUIDELINES FOR INCREASING FIBER IN THE DIET  Start adding fiber to the diet slowly. A gradual increase of about 5 more grams (2 slices of whole-wheat bread, 2 servings of most fruits or vegetables, or 1 bowl of high-fiber cereal) per day is best. Too rapid an increase in fiber may result in constipation, flatulence, and bloating.   Drink enough water and fluids to keep your urine clear or pale yellow. Water, juice, or caffeine-free drinks are recommended. Not drinking enough fluid may cause constipation.   Eat a variety of high-fiber foods rather than one type of fiber.   Try to increase your intake of fiber through using high-fiber foods rather than fiber pills or supplements that contain small amounts of fiber.   The goal is to change the types of food eaten. Do not supplement your present diet with high-fiber foods, but replace foods in your present diet.   INCLUDE A VARIETY OF FIBER SOURCES  Replace refined and processed grains with whole grains, canned fruits with fresh fruits, and incorporate other fiber sources. White rice, white breads, and most bakery goods contain little or no fiber.   Brown whole-grain rice, buckwheat oats, and many fruits and vegetables are all good sources of fiber. These include: broccoli, Brussels sprouts, cabbage, cauliflower, beets, sweet potatoes, white potatoes (skin on), carrots, tomatoes, eggplant, squash, berries, fresh fruits, and dried fruits.   Cereals appear to be the richest source of fiber. Cereal fiber is found in whole grains and bran. Bran is the fiber-rich outer coat of cereal grain, which is largely removed in refining. In whole-grain cereals, the bran remains. In breakfast cereals, the largest amount of fiber is found in those with "bran" in their names. The fiber content is sometimes indicated on the label.   You may need to include additional fruits and vegetables each day.   In  baking, for 1 cup white flour, you may use the following substitutions:   1 cup whole-wheat flour minus 2 tablespoons.   1/2 cup white flour plus 1/2 cup whole-wheat flour.   Polyps, Colon  A polyp is extra tissue that grows inside your body. Colon polyps grow in the large intestine. The large intestine, also called the colon, is part of your digestive system. It is a long, hollow tube at the end of your digestive tract where your body makes and stores stool. Most polyps are not dangerous. They are benign. This means they are not cancerous. But over time, some types of polyps can turn into cancer. Polyps that are smaller than a pea are usually not harmful. But larger polyps could someday become or may already be cancerous. To be safe, doctors remove all polyps and test them.   PREVENTION There is not one sure way to prevent polyps. You might be able to lower your risk of getting them if you:  Eat more fruits and vegetables and less fatty food.   Do not smoke.  Avoid alcohol.   Exercise every day.   Lose weight if you are overweight.   Eating more calcium and folate can also lower your risk of getting polyps. Some foods that are rich in calcium are milk, cheese, and broccoli. Some foods that are rich in folate are chickpeas, kidney beans, and spinach.    Diverticulosis Diverticulosis is a common condition that develops when small pouches (diverticula) form in the wall of the colon. The risk of diverticulosis increases with age. It happens more often in people who eat a low-fiber diet. Most individuals with diverticulosis have no symptoms. Those individuals with symptoms usually experience belly (abdominal) pain, constipation, or loose stools (diarrhea).  HOME CARE INSTRUCTIONS  Increase the amount of fiber in your diet as directed by your caregiver or dietician. This may reduce symptoms of diverticulosis.   Drink at least 6 to 8 glasses of water each day to prevent constipation.    Try not to strain when you have a bowel movement.   Avoiding nuts and seeds to prevent complications is NOT NECESSARY.     FOODS HAVING HIGH FIBER CONTENT INCLUDE:  Fruits. Apple, peach, pear, tangerine, raisins, prunes.   Vegetables. Brussels sprouts, asparagus, broccoli, cabbage, carrot, cauliflower, romaine lettuce, spinach, summer squash, tomato, winter squash, zucchini.   Starchy Vegetables. Baked beans, kidney beans, lima beans, split peas, lentils, potatoes (with skin).   Grains. Whole wheat bread, brown rice, bran flake cereal, plain oatmeal, white rice, shredded wheat, bran muffins.   SEEK IMMEDIATE MEDICAL CARE IF:  You develop increasing pain or severe bloating.   You have an oral temperature above 101F.   You develop vomiting or bowel movements that are bloody or black.   Hemorrhoids Hemorrhoids are dilated (enlarged) veins around the rectum. Sometimes clots will form in the veins. This makes them swollen and painful. These are called thrombosed hemorrhoids. Causes of hemorrhoids include:  Constipation.   Straining to have a bowel movement.   HEAVY LIFTING  HOME CARE INSTRUCTIONS  Eat a well balanced diet and drink 6 to 8 glasses of water every day to avoid constipation. You may also use a bulk laxative.   Avoid straining to have bowel movements.   Keep anal area dry and clean.   Do not use a donut shaped pillow or sit on the toilet for long periods. This increases blood pooling and pain.   Move your bowels when your body has the urge; this will require less straining and will decrease pain and pressure.

## 2016-02-22 NOTE — H&P (Signed)
  Primary Care Physician:  Jannifer Rodneyhristy Hawks, FNP Primary Gastroenterologist:  Dr. Darrick PennaFields  Pre-Procedure History & Physical: HPI:  Austin Rush is a 55 y.o. male here for COLON CANCER SCREENING.  Past Medical History  Diagnosis Date  . Hypertension     Past Surgical History  Procedure Laterality Date  . Appendectomy      Prior to Admission medications   Medication Sig Start Date End Date Taking? Authorizing Provider  hydrochlorothiazide (HYDRODIURIL) 12.5 MG tablet Take 1 tablet (12.5 mg total) by mouth daily. 10/18/15  Yes Elige RadonJoshua A Dettinger, MD  polyethylene glycol-electrolytes (NULYTELY/GOLYTELY) 420 g solution Take 4,000 mLs by mouth once. 01/23/16  Yes Anice PaganiniEric A Gill, NP  Vitamin D, Ergocalciferol, (DRISDOL) 50000 units CAPS capsule Take 50,000 Units by mouth every 7 (seven) days.   Yes Historical Provider, MD    Allergies as of 01/23/2016  . (No Known Allergies)    Family History  Problem Relation Age of Onset  . Diabetes Mother   . Cancer Father     PROSTATE  . Colon cancer Neg Hx     Social History   Social History  . Marital Status: Single    Spouse Name: N/A  . Number of Children: N/A  . Years of Education: N/A   Occupational History  . Not on file.   Social History Main Topics  . Smoking status: Former Smoker    Quit date: 02/21/2011  . Smokeless tobacco: Not on file  . Alcohol Use: 0.0 oz/week    0 Standard drinks or equivalent per week     Comment: Wine about once a month  . Drug Use: No  . Sexual Activity: Not on file   Other Topics Concern  . Not on file   Social History Narrative    Review of Systems: See HPI, otherwise negative ROS   Physical Exam: BP 161/92 mmHg  Pulse 87  Temp(Src) 98.2 F (36.8 C) (Oral)  Resp 14  Ht 5\' 6"  (1.676 m)  Wt 239 lb (108.41 kg)  BMI 38.59 kg/m2  SpO2 99% General:   Alert,  pleasant and cooperative in NAD Head:  Normocephalic and atraumatic. Neck:  Supple; Lungs:  Clear throughout to auscultation.     Heart:  Regular rate and rhythm. Abdomen:  Soft, nontender and nondistended. Normal bowel sounds, without guarding, and without rebound.   Neurologic:  Alert and  oriented x4;  grossly normal neurologically.  Impression/Plan:    SCREENING  Plan:  1. TCS TODAY

## 2016-02-22 NOTE — Progress Notes (Signed)
REVIEWED-NO ADDITIONAL RECOMMENDATIONS. 

## 2016-02-22 NOTE — Op Note (Signed)
[facility] Patient Name: Austin Rush Procedure Date: 02/22/2016 11:10 AM MRN: [id number] Date of Birth: 1961-02-09 Attending MD: Jonette Eva , MD CSN: [id number] Age: 55 Admit Type: Outpatient Procedure:                Colonoscopy WITH SNARE POLYPECTOMY(2) Indications:              Screening for colorectal malignant neoplasm Providers:                Jonette Eva, MD, Nena Polio, RN, Ina Homes,                            Technician Referring MD:             Edilia Bo Hawks Medicines:                Meperidine 100 mg IV, Midazolam 8 mg IV Complications:            No immediate complications. Estimated Blood Loss:     Estimated blood loss was minimal. Procedure:                Pre-Anesthesia Assessment:                           - Prior to the procedure, a History and Physical                            was performed, and patient medications and                            allergies were reviewed. The patient's tolerance of                            previous anesthesia was also reviewed. The risks                            and benefits of the procedure and the sedation                            options and risks were discussed with the patient.                            All questions were answered, and informed consent                            was obtained. Prior Anticoagulants: The patient has                            taken no previous anticoagulant or antiplatelet                            agents. ASA Grade Assessment: I - A normal, healthy                            patient. After reviewing the risks and benefits,  the patient was deemed in satisfactory condition to                            undergo the procedure. After obtaining informed                            consent, the colonoscope was passed under direct                            vision. Throughout the procedure, the patient's                            blood pressure, pulse, and  oxygen saturations were                            monitored continuously. The EC-[id number] ([id number]                            scope was introduced through the anus and advanced                            to the the cecum, identified by appendiceal orifice                            and ileocecal valve. The ileocecal valve,                            appendiceal orifice, and rectum were photographed.                            The colonoscopy was somewhat difficult due to                            significant looping. Successful completion of the                            procedure was aided by straightening and shortening                            the scope to obtain bowel loop reduction. The                            patient tolerated the procedure well. The quality                            of the bowel preparation was excellent. Scope In: 11:42:36 AM Scope Out: 12:00:59 PM Scope Withdrawal Time: 0 hours 15 minutes 36 seconds  Total Procedure Duration: 0 hours 18 minutes 23 seconds  Findings:      The perianal examination was normal.      A few small and large-mouthed diverticula were found in the ascending       colon and cecum.      Two sessile polyps were found in the rectum. The polyps were 3 to 6 mm  in size. These polyps were removed with a hot snare. Resection and       retrieval were complete.      Non-bleeding internal hemorrhoids were found. The hemorrhoids were small. Impression:               - Diverticulosis in the ascending colon and in the                            cecum.                           - Two polyps in the rectum, removed                           - Non-bleeding internal hemorrhoids. Moderate Sedation:      Moderate (conscious) sedation was administered by the endoscopy nurse       and supervised by the endoscopist. The following parameters were       monitored: oxygen saturation, heart rate, blood pressure, and response       to care. Total  physician intraservice time was 40 minutes. Recommendation:           - High fiber diet.                           - Continue present medications.                           - Await pathology results.                           - Repeat colonoscopy in 5-10 years for surveillance.                           - Patient has a contact number available for                            emergencies. The signs and symptoms of potential                            delayed complications were discussed with the                            patient. Return to normal activities tomorrow.                            Written discharge instructions were provided to the                            patient. Procedure Code(s):        --- Professional ---                           [phone], Colonoscopy, flexible; with removal of                            tumor(s), polyp(s), or other lesion(s) by snare  technique                           [id number], Moderate sedation services; each additional                            15 minutes intraservice time                           99153, Moderate sedation services; each additional                            15 minutes intraservice time                           G0500, Moderate sedation services provided by the                            same physician or other qualified health care                            professional performing a gastrointestinal                            endoscopic service that sedation supports,                            requiring the presence of an independent trained                            observer to assist in the monitoring of the                            patient's level of consciousness and physiological                            status; initial 15 minutes of intra-service time;                            patient age 86 years or older (additional time may                            be reported with 16109, as  appropriate) Diagnosis Code(s):        --- Professional ---                           Z12.11, Encounter for screening for malignant                            neoplasm of colon                           K64.8, Other hemorrhoids                           K62.1, Rectal polyp  K57.30, Diverticulosis of large intestine without                            perforation or abscess without bleeding CPT copyright 2016 [employer]. All rights reserved. The codes documented in this report are preliminary and upon coder review may  be revised to meet current compliance requirements. Jonette Eva, MD Jonette Eva, MD 02/22/2016 4:38:22 PM This report has been signed electronically. Number of Addenda: 0

## 2016-02-26 ENCOUNTER — Encounter (HOSPITAL_COMMUNITY): Payer: Self-pay | Admitting: Gastroenterology

## 2016-03-04 ENCOUNTER — Telehealth: Payer: Self-pay | Admitting: Gastroenterology

## 2016-03-04 NOTE — Telephone Encounter (Signed)
Reminder in epic °

## 2016-03-04 NOTE — Telephone Encounter (Signed)
LMOM to call.

## 2016-03-04 NOTE — Telephone Encounter (Signed)
Please call pt.HE had HYPERPLASTIC POLYPS removed.   DRINK WATER TO KEEP YOUR URINE LIGHT YELLOW.  CONTINUE YOUR WEIGHT LOSS EFFORTS. LOSE 10 LBS. OBESITY IS ASSOCIATED WITH AN INCREASE FOR ALL CANCERS, INCLUDING ESOPHAGEAL AND COLON CANCER.  FOLLOW A HIGH FIBER DIET. AVOID ITEMS THAT CAUSE BLOATING.   Next colonoscopy in 10 years.

## 2016-03-05 NOTE — Telephone Encounter (Signed)
Letter mailed to pt to call for results.  

## 2016-04-18 ENCOUNTER — Other Ambulatory Visit: Payer: Self-pay | Admitting: Family

## 2016-04-18 ENCOUNTER — Other Ambulatory Visit: Payer: Self-pay | Admitting: Family Medicine

## 2016-04-18 DIAGNOSIS — I10 Essential (primary) hypertension: Secondary | ICD-10-CM

## 2016-05-05 ENCOUNTER — Encounter: Payer: Self-pay | Admitting: Family Medicine

## 2016-05-05 ENCOUNTER — Ambulatory Visit (INDEPENDENT_AMBULATORY_CARE_PROVIDER_SITE_OTHER): Payer: BLUE CROSS/BLUE SHIELD | Admitting: Family Medicine

## 2016-05-05 VITALS — BP 135/87 | HR 80 | Temp 98.5°F | Ht 66.0 in | Wt 238.2 lb

## 2016-05-05 DIAGNOSIS — I10 Essential (primary) hypertension: Secondary | ICD-10-CM

## 2016-05-05 DIAGNOSIS — Z125 Encounter for screening for malignant neoplasm of prostate: Secondary | ICD-10-CM | POA: Diagnosis not present

## 2016-05-05 DIAGNOSIS — Z9189 Other specified personal risk factors, not elsewhere classified: Secondary | ICD-10-CM

## 2016-05-05 DIAGNOSIS — E559 Vitamin D deficiency, unspecified: Secondary | ICD-10-CM

## 2016-05-05 MED ORDER — VITAMIN D (ERGOCALCIFEROL) 1.25 MG (50000 UNIT) PO CAPS: 50000.0000 [IU] | ORAL_CAPSULE | ORAL | 0 refills | Status: DC

## 2016-05-05 MED ORDER — HYDROCHLOROTHIAZIDE 12.5 MG PO TABS: 12.5000 mg | ORAL_TABLET | Freq: Every day | ORAL | 2 refills | Status: DC

## 2016-05-05 NOTE — Progress Notes (Signed)
BP 135/87 (BP Location: Left Arm, Patient Position: Sitting, Cuff Size: Large)   Pulse 80   Temp 98.5 F (36.9 C) (Oral)   Ht _0  (1.676 m)   Wt 238 lb 3.2 oz (108 kg)   BMI 38.45 kg/m    Subjective:    Patient ID: Austin Rush, male    DOB: [birth year], 55 y.o.   MRN: [id number]  HPI: Austin Rush is a 55 y.o. male presenting on 05/05/2016 for Medication refills and Snoring (wife is telling him that he is snoring at night and has episodes of apnea)   HPI Hypertension recheck Patient is coming in for hypertension recheck today. His blood pressure is 135/87. He is currently on hydrochlorothiazide 12.5 mg. Patient denies headaches, blurred vision, chest pains, shortness of breath, or weakness. Denies any side effects from medication and is content with current medication.   Vitamin D deficiency recheck Patient is due for a vitamin D recheck. He has not had any issues with medication and his energy has been doing well.  Sleep apnea symptoms Patient was told last visit that he was at risk for sleep apnea because of his weight and his neck size and his deep hard palate. He went home and discuss that with his wife and said that she complains of him snoring a lot and loudly but she also said that he stops breathing frequently while he is sleeping. He would like to go and do a sleep study. He does have difficulty waking up in the morning like he didn't sleep. He also falls asleep easily throughout the day. He denies any headaches in the morning. He also says that he wakes up with a dry throat in the morning.  Relevant past medical, surgical, family and social history reviewed and updated as indicated. Interim medical history since our last visit reviewed. Allergies and medications reviewed and updated.  Review of Systems  Constitutional: Negative for fever.  HENT: Negative for ear discharge and ear pain.   Eyes: Negative for discharge and visual disturbance.  Respiratory: Negative for  shortness of breath and wheezing.   Cardiovascular: Negative for chest pain and leg swelling.  Gastrointestinal: Negative for abdominal pain, constipation and diarrhea.  Genitourinary: Negative for difficulty urinating.  Musculoskeletal: Negative for back pain and gait problem.  Skin: Negative for rash.  Neurological: Negative for syncope, light-headedness and headaches.  Psychiatric/Behavioral: Positive for sleep disturbance.  All other systems reviewed and are negative.   Per HPI unless specifically indicated above     Medication List       Accurate as of 05/05/16  4:22 PM. Always use your most recent med list.          hydrochlorothiazide 12.5 MG tablet Commonly known as:  HYDRODIURIL Take 1 tablet (12.5 mg total) by mouth daily.   Vitamin D (Ergocalciferol) 50000 units Caps capsule Commonly known as:  DRISDOL Take 1 capsule (50,000 Units total) by mouth once a week.          Objective:    BP 135/87 (BP Location: Left Arm, Patient Position: Sitting, Cuff Size: Large)   Pulse 80   Temp 98.5 F (36.9 C) (Oral)   Ht _1  (1.676 m)   Wt 238 lb 3.2 oz (108 kg)   BMI 38.45 kg/m   Wt Readings from Last 3 Encounters:  05/05/16 238 lb 3.2 oz (108 kg)  02/22/16 239 lb (108.4 kg)  01/23/16 238 lb 12.8 oz (108.3 kg)    Physical Exam  Constitutional: He is oriented to person, place, and time. He appears well-developed and well-nourished. No distress.  Eyes: Conjunctivae and EOM are normal. Pupils are equal, round, and reactive to light. Right eye exhibits no discharge. No scleral icterus.  Neck: Neck supple. No thyromegaly present.  Cardiovascular: Normal rate, regular rhythm, normal heart sounds and intact distal pulses.   No murmur heard. Pulmonary/Chest: Effort normal and breath sounds normal. No respiratory distress. He has no wheezes.  Musculoskeletal: Normal range of motion. He exhibits no edema.  Lymphadenopathy:    He has no cervical adenopathy.    Neurological: He is alert and oriented to person, place, and time. Coordination normal.  Skin: Skin is warm and dry. No rash noted. He is not diaphoretic.  Psychiatric: He has a normal mood and affect. His behavior is normal.  Nursing note and vitals reviewed.      Assessment & Plan:   Problem List Items Addressed This Visit      Cardiovascular and Mediastinum   Essential hypertension   Relevant Medications   hydrochlorothiazide (HYDRODIURIL) 12.5 MG tablet   Other Relevant Orders   CBC with Differential/Platelet   CMP14+EGFR   Lipid panel     Other   Vitamin D deficiency   Relevant Medications   Vitamin D, Ergocalciferol, (DRISDOL) 50000 units CAPS capsule   Other Relevant Orders   VITAMIN D 25 Hydroxy (Vit-D Deficiency, Fractures)    Other Visit Diagnoses    At risk for obstructive sleep apnea    -  Primary   Relevant Orders   Ambulatory referral to Sleep Studies   CBC with Differential/Platelet   TSH   Prostate cancer screening       Relevant Orders   PSA, total and free       Follow up plan: Return in about 6 months (around 11/05/2016), or if symptoms worsen or fail to improve, for Hypertension recheck.  Counseling provided for all of the vaccine components Orders Placed This Encounter  Procedures  . CBC with Differential/Platelet  . CMP14+EGFR  . Lipid panel  . TSH  . PSA, total and free  . VITAMIN D 25 Hydroxy (Vit-D Deficiency, Fractures)  . Ambulatory referral to Sleep Studies    Caryl Pina, MD [facility] 05/05/2016, 4:22 PM

## 2016-05-10 ENCOUNTER — Other Ambulatory Visit: Payer: BLUE CROSS/BLUE SHIELD

## 2016-05-10 DIAGNOSIS — I10 Essential (primary) hypertension: Secondary | ICD-10-CM

## 2016-05-10 DIAGNOSIS — E559 Vitamin D deficiency, unspecified: Secondary | ICD-10-CM

## 2016-05-10 DIAGNOSIS — Z9189 Other specified personal risk factors, not elsewhere classified: Secondary | ICD-10-CM

## 2016-05-10 DIAGNOSIS — Z125 Encounter for screening for malignant neoplasm of prostate: Secondary | ICD-10-CM

## 2016-05-12 ENCOUNTER — Other Ambulatory Visit: Payer: BLUE CROSS/BLUE SHIELD

## 2016-05-13 LAB — CBC WITH DIFFERENTIAL/PLATELET
BASOS ABS: 0 10*3/uL (ref 0.0–0.2)
Basos: 0 %
EOS (ABSOLUTE): 0.3 10*3/uL (ref 0.0–0.4)
Eos: 4 %
HEMATOCRIT: 43.8 % (ref 37.5–51.0)
HEMOGLOBIN: 15.2 g/dL (ref 12.6–17.7)
IMMATURE GRANS (ABS): 0 10*3/uL (ref 0.0–0.1)
IMMATURE GRANULOCYTES: 0 %
LYMPHS ABS: 3.4 10*3/uL — AB (ref 0.7–3.1)
Lymphs: 42 %
MCH: 28.9 pg (ref 26.6–33.0)
MCHC: 34.7 g/dL (ref 31.5–35.7)
MCV: 83 fL (ref 79–97)
MONOS ABS: 0.8 10*3/uL (ref 0.1–0.9)
Monocytes: 9 %
Neutrophils Absolute: 3.6 10*3/uL (ref 1.4–7.0)
Neutrophils: 45 %
Platelets: 255 10*3/uL (ref 150–379)
RBC: 5.26 x10E6/uL (ref 4.14–5.80)
RDW: 14.7 % (ref 12.3–15.4)
WBC: 8.1 10*3/uL (ref 3.4–10.8)

## 2016-05-13 LAB — CMP14+EGFR
ALBUMIN: 4.4 g/dL (ref 3.5–5.5)
ALT: 53 IU/L — ABNORMAL HIGH (ref 0–44)
AST: 32 IU/L (ref 0–40)
Albumin/Globulin Ratio: 1.6 (ref 1.2–2.2)
Alkaline Phosphatase: 68 IU/L (ref 39–117)
BUN / CREAT RATIO: 13 (ref 9–20)
BUN: 11 mg/dL (ref 6–24)
Bilirubin Total: 0.4 mg/dL (ref 0.0–1.2)
CALCIUM: 9.5 mg/dL (ref 8.7–10.2)
CO2: 24 mmol/L (ref 18–29)
CREATININE: 0.86 mg/dL (ref 0.76–1.27)
Chloride: 104 mmol/L (ref 96–106)
GFR calc Af Amer: 112 mL/min/{1.73_m2} (ref >59)
GFR, EST NON AFRICAN AMERICAN: 97 mL/min/{1.73_m2} (ref >59)
GLOBULIN, TOTAL: 2.7 g/dL (ref 1.5–4.5)
Glucose: 91 mg/dL (ref 65–99)
Potassium: 4.7 mmol/L (ref 3.5–5.2)
SODIUM: 143 mmol/L (ref 134–144)
TOTAL PROTEIN: 7.1 g/dL (ref 6.0–8.5)

## 2016-05-13 LAB — TSH: TSH: 1.55 u[IU]/mL (ref 0.450–4.500)

## 2016-05-13 LAB — PSA, TOTAL AND FREE
PROSTATE SPECIFIC AG, SERUM: 0.8 ng/mL (ref 0.0–4.0)
PSA FREE: 0.33 ng/mL
PSA, Free Pct: 41.3 %

## 2016-05-13 LAB — LIPID PANEL
CHOL/HDL RATIO: 6.8 ratio — AB (ref 0.0–5.0)
Cholesterol, Total: 219 mg/dL — ABNORMAL HIGH (ref 100–199)
HDL: 32 mg/dL — ABNORMAL LOW (ref >39)
LDL CALC: 150 mg/dL — AB (ref 0–99)
Triglycerides: 183 mg/dL — ABNORMAL HIGH (ref 0–149)
VLDL CHOLESTEROL CAL: 37 mg/dL (ref 5–40)

## 2016-05-13 LAB — VITAMIN D 25 HYDROXY (VIT D DEFICIENCY, FRACTURES): Vit D, 25-Hydroxy: 17.8 ng/mL — ABNORMAL LOW (ref 30.0–100.0)

## 2016-05-19 ENCOUNTER — Encounter: Payer: Self-pay | Admitting: Family Medicine

## 2016-05-19 ENCOUNTER — Ambulatory Visit (INDEPENDENT_AMBULATORY_CARE_PROVIDER_SITE_OTHER): Payer: BLUE CROSS/BLUE SHIELD | Admitting: Family Medicine

## 2016-05-19 VITALS — BP 136/83 | HR 84 | Temp 97.8°F | Ht 66.0 in | Wt 238.0 lb

## 2016-05-19 DIAGNOSIS — G4730 Sleep apnea, unspecified: Secondary | ICD-10-CM

## 2016-05-19 DIAGNOSIS — N529 Male erectile dysfunction, unspecified: Secondary | ICD-10-CM | POA: Diagnosis not present

## 2016-05-19 MED ORDER — TADALAFIL 20 MG PO TABS: 10.0000 mg | ORAL_TABLET | ORAL | 11 refills | Status: DC | PRN

## 2016-05-19 NOTE — Progress Notes (Signed)
BP 136/83   Pulse 84   Temp 97.8 F (36.6 C) (Oral)   Ht 5\' 6"  (1.676 m)   Wt 238 lb (108 kg)   BMI 38.41 kg/m    Subjective:    Patient ID: Austin Rush, male    DOB: [birth year], 55 y.o.   MRN: [id number]  HPI: Austin LotJames Boroff is a 55 y.o. male presenting on 05/19/2016 for Personal Problem   HPI Erectile dysfunction Patient has been having difficulties with both getting and keeping an erection. He can finish once he gets it but just has trouble keeping a long enough. It hasn't been every time that he had issues with that but he would like to try something to help him with these issues. He denies any urinary issues with difficulty of stream or anything like that.  Snoring and sleep apnea symptoms Patient comes in admitting that he has some sleep apnea symptoms because his wife told him that he does. She says that he both snores and stops breathing while he snoring and has been doing this for quite some time. He is ready to go ahead and get a sleep study. He does admit to having nonrestorative sleep. He does not have headaches when he wakes up in the morning. He does not feel like it is affecting his concentration or memory.  Relevant past medical, surgical, family and social history reviewed and updated as indicated. Interim medical history since our last visit reviewed. Allergies and medications reviewed and updated.  Review of Systems  Constitutional: Negative for chills and fever.  Eyes: Negative for discharge.  Respiratory: Negative for shortness of breath and wheezing.   Cardiovascular: Negative for chest pain and leg swelling.  Genitourinary: Negative for dysuria, frequency, penile pain, penile swelling, scrotal swelling and urgency.  Musculoskeletal: Negative for back pain and gait problem.  Skin: Negative for rash.  Psychiatric/Behavioral: Positive for sleep disturbance.  All other systems reviewed and are negative.   Per HPI unless specifically indicated above       Medication List       Accurate as of 05/19/16 11:59 PM. Always use your most recent med list.          atorvastatin 20 MG tablet Commonly known as:  LIPITOR Take 20 mg by mouth daily.   hydrochlorothiazide 12.5 MG tablet Commonly known as:  HYDRODIURIL Take 1 tablet (12.5 mg total) by mouth daily.   tadalafil 20 MG tablet Commonly known as:  CIALIS Take 0.5-1 tablets (10-20 mg total) by mouth every other day as needed for erectile dysfunction.   Vitamin D (Ergocalciferol) 50000 units Caps capsule Commonly known as:  DRISDOL Take 1 capsule (50,000 Units total) by mouth once a week.          Objective:    BP 136/83   Pulse 84   Temp 97.8 F (36.6 C) (Oral)   Ht 5\' 6"  (1.676 m)   Wt 238 lb (108 kg)   BMI 38.41 kg/m   Wt Readings from Last 3 Encounters:  05/19/16 238 lb (108 kg)  05/05/16 238 lb 3.2 oz (108 kg)  02/22/16 239 lb (108.4 kg)    Physical Exam  Constitutional: He is oriented to person, place, and time. He appears well-developed and well-nourished. No distress.  Obese  Eyes: Conjunctivae are normal. Right eye exhibits no discharge. No scleral icterus.  Cardiovascular: Normal rate, regular rhythm, normal heart sounds and intact distal pulses.   No murmur heard. Pulmonary/Chest: Effort normal and breath sounds  normal. No respiratory distress. He has no wheezes. He has no rales. He exhibits no tenderness.  Musculoskeletal: Normal range of motion. He exhibits no edema.  Neurological: He is alert and oriented to person, place, and time. Coordination normal.  Skin: Skin is warm and dry. No rash noted. He is not diaphoretic.  Psychiatric: He has a normal mood and affect. His behavior is normal. Cognition and memory are not impaired.  Nursing note and vitals reviewed.     Assessment & Plan:   Problem List Items Addressed This Visit      Genitourinary   Erectile dysfunction   Relevant Medications   tadalafil (CIALIS) 20 MG tablet    Other Visit  Diagnoses    Sleep apnea    -  Primary   Relevant Orders   Ambulatory referral to Sleep Studies       Follow up plan: Return in about 6 months (around 11/19/2016), or if symptoms worsen or fail to improve, for Cholesterol recheck.  Counseling provided for all of the vaccine components Orders Placed This Encounter  Procedures  . Ambulatory referral to Sleep Studies    Arville Care, MD [facility] 05/21/2016, 9:57 PM

## 2016-05-21 ENCOUNTER — Telehealth: Payer: Self-pay | Admitting: Family Medicine

## 2016-07-30 DIAGNOSIS — R109 Unspecified abdominal pain: Secondary | ICD-10-CM | POA: Diagnosis not present

## 2016-07-30 DIAGNOSIS — K2900 Acute gastritis without bleeding: Secondary | ICD-10-CM | POA: Diagnosis not present

## 2016-11-19 ENCOUNTER — Ambulatory Visit: Payer: BLUE CROSS/BLUE SHIELD | Admitting: Family Medicine

## 2016-11-20 ENCOUNTER — Encounter: Payer: Self-pay | Admitting: Family Medicine

## 2016-11-20 ENCOUNTER — Telehealth: Payer: Self-pay | Admitting: Family Medicine

## 2016-12-04 ENCOUNTER — Encounter: Payer: Self-pay | Admitting: Family Medicine

## 2016-12-04 ENCOUNTER — Ambulatory Visit (INDEPENDENT_AMBULATORY_CARE_PROVIDER_SITE_OTHER): Payer: BLUE CROSS/BLUE SHIELD | Admitting: Family Medicine

## 2016-12-04 VITALS — BP 147/88 | HR 79 | Temp 98.7°F | Ht 66.0 in | Wt 235.1 lb

## 2016-12-04 DIAGNOSIS — E559 Vitamin D deficiency, unspecified: Secondary | ICD-10-CM

## 2016-12-04 DIAGNOSIS — N529 Male erectile dysfunction, unspecified: Secondary | ICD-10-CM

## 2016-12-04 DIAGNOSIS — I10 Essential (primary) hypertension: Secondary | ICD-10-CM

## 2016-12-04 DIAGNOSIS — R739 Hyperglycemia, unspecified: Secondary | ICD-10-CM

## 2016-12-04 LAB — CMP14+EGFR
ALT: 44 IU/L (ref 0–44)
AST: 30 IU/L (ref 0–40)
Albumin/Globulin Ratio: 1.6 (ref 1.2–2.2)
Albumin: 4.4 g/dL (ref 3.5–5.5)
Alkaline Phosphatase: 64 IU/L (ref 39–117)
BUN/Creatinine Ratio: 10 (ref 9–20)
BUN: 8 mg/dL (ref 6–24)
Bilirubin Total: 0.6 mg/dL (ref 0.0–1.2)
CALCIUM: 9.7 mg/dL (ref 8.7–10.2)
CO2: 25 mmol/L (ref 18–29)
Chloride: 99 mmol/L (ref 96–106)
Creatinine, Ser: 0.8 mg/dL (ref 0.76–1.27)
GFR, EST AFRICAN AMERICAN: 115 mL/min/{1.73_m2} (ref >59)
GFR, EST NON AFRICAN AMERICAN: 100 mL/min/{1.73_m2} (ref >59)
GLUCOSE: 134 mg/dL — AB (ref 65–99)
Globulin, Total: 2.8 g/dL (ref 1.5–4.5)
Potassium: 4.4 mmol/L (ref 3.5–5.2)
Sodium: 140 mmol/L (ref 134–144)
TOTAL PROTEIN: 7.2 g/dL (ref 6.0–8.5)

## 2016-12-04 MED ORDER — VITAMIN D (ERGOCALCIFEROL) 1.25 MG (50000 UNIT) PO CAPS: 50000.0000 [IU] | ORAL_CAPSULE | ORAL | 1 refills | Status: DC

## 2016-12-04 MED ORDER — TADALAFIL 20 MG PO TABS: 10.0000 mg | ORAL_TABLET | ORAL | 11 refills | Status: DC | PRN

## 2016-12-04 MED ORDER — HYDROCHLOROTHIAZIDE 12.5 MG PO TABS: 12.5000 mg | ORAL_TABLET | Freq: Every day | ORAL | 2 refills | Status: DC

## 2016-12-04 MED ORDER — SILDENAFIL CITRATE 20 MG PO TABS: 20.0000 mg | ORAL_TABLET | ORAL | 2 refills | Status: DC | PRN

## 2016-12-04 MED ORDER — ATORVASTATIN CALCIUM 20 MG PO TABS: 20.0000 mg | ORAL_TABLET | Freq: Every day | ORAL | 5 refills | Status: DC

## 2016-12-04 NOTE — Progress Notes (Signed)
BP (!) 147/88   Pulse 79   Temp 98.7 F (37.1 C) (Oral)   Ht '5\' 6"'$  (1.676 m)   Wt 235 lb 2 oz (106.7 kg)   BMI 37.95 kg/m    Subjective:    Patient ID: Austin Rush, male    DOB: 17-Jun-1961, 56 y.o.   MRN: [id number]  HPI: Austin Rush is a 56 y.o. male presenting on 12/04/2016 for Hypertension (6 month followup; patient is fasting); Hyperlipidemia; and Referral (for sleep apnea, never went before when he was supposed to)   HPI Hypertension recheck Patient is coming in today for hypertension recheck. His blood pressure today is 147/88. He says he ran out of his  medication about 1 week ago. He had been taking hydrochlorothiazide 12.5 mg. Patient denies headaches, blurred vision, chest pains, shortness of breath, or weakness. Denies any side effects from medication and is content with current medication.   Erectile dysfunction Patient is coming in for erectile dysfunction. He needs a refill on his medication but he feels like the Cialis is not working and he would like some Viagra to try to see if that will work better. He denies any pain but just has difficulty starting and sustaining erection  Vitamin D deficiency refill  Relevant past medical, surgical, family and social history reviewed and updated as indicated. Interim medical history since our last visit reviewed. Allergies and medications reviewed and updated.  Review of Systems  Constitutional: Negative for chills and fever.  Eyes: Negative for discharge.  Respiratory: Negative for shortness of breath and wheezing.   Cardiovascular: Negative for chest pain and leg swelling.  Musculoskeletal: Negative for back pain and gait problem.  Skin: Negative for rash.  Neurological: Negative for dizziness, weakness, light-headedness, numbness and headaches.  All other systems reviewed and are negative.   Per HPI unless specifically indicated above   Allergies as of 12/04/2016   No Known Allergies     Medication List      Accurate as of 12/04/16  8:27 AM. Always use your most recent med list.          atorvastatin 20 MG tablet Commonly known as:  LIPITOR Take 1 tablet (20 mg total) by mouth daily.   hydrochlorothiazide 12.5 MG tablet Commonly known as:  HYDRODIURIL Take 1 tablet (12.5 mg total) by mouth daily.   tadalafil 20 MG tablet Commonly known as:  CIALIS Take 0.5-1 tablets (10-20 mg total) by mouth every other day as needed for erectile dysfunction.   Vitamin D (Ergocalciferol) 50000 units Caps capsule Commonly known as:  DRISDOL Take 1 capsule (50,000 Units total) by mouth once a week.          Objective:    BP (!) 147/88   Pulse 79   Temp 98.7 F (37.1 C) (Oral)   Ht '5\' 6"'$  (1.676 m)   Wt 235 lb 2 oz (106.7 kg)   BMI 37.95 kg/m   Wt Readings from Last 3 Encounters:  12/04/16 235 lb 2 oz (106.7 kg)  05/19/16 238 lb (108 kg)  05/05/16 238 lb 3.2 oz (108 kg)    Physical Exam  Constitutional: He is oriented to person, place, and time. He appears well-developed and well-nourished. No distress.  Eyes: Conjunctivae are normal. No scleral icterus.  Neck: Neck supple. No thyromegaly present.  Cardiovascular: Normal rate, regular rhythm, normal heart sounds and intact distal pulses.   No murmur heard. Pulmonary/Chest: Effort normal and breath sounds normal. No respiratory distress. He has  no wheezes.  Musculoskeletal: Normal range of motion. He exhibits no edema.  Lymphadenopathy:    He has no cervical adenopathy.  Neurological: He is alert and oriented to person, place, and time. Coordination normal.  Skin: Skin is warm and dry. No rash noted. He is not diaphoretic.  Psychiatric: He has a normal mood and affect. His behavior is normal.  Nursing note and vitals reviewed.     Assessment & Plan:   Problem List Items Addressed This Visit      Cardiovascular and Mediastinum   Essential hypertension   Relevant Medications   tadalafil (CIALIS) 20 MG tablet   hydrochlorothiazide  (HYDRODIURIL) 12.5 MG tablet   atorvastatin (LIPITOR) 20 MG tablet   sildenafil (REVATIO) 20 MG tablet   Other Relevant Orders   CMP14+EGFR     Genitourinary   Erectile dysfunction   Relevant Medications   tadalafil (CIALIS) 20 MG tablet     Other   Vitamin D deficiency   Relevant Medications   Vitamin D, Ergocalciferol, (DRISDOL) 50000 units CAPS capsule       Follow up plan: Return in about 3 months (around 03/06/2017), or if symptoms worsen or fail to improve, for Hypertension recheck and fasting labs.  Counseling provided for all of the vaccine components Orders Placed This Encounter  Procedures  . [place] Jossette Zirbel, MD [facility] 12/04/2016, 8:27 AM

## 2016-12-08 LAB — BAYER DCA HB A1C WAIVED: HB A1C: 8.2 % — AB (ref <7.0)

## 2016-12-08 NOTE — Addendum Note (Signed)
Addended by: Tamera PuntWRAY, Cantrell Martus S on: 12/08/2016 08:53 AM   Modules accepted: Orders

## 2017-05-21 DIAGNOSIS — Z125 Encounter for screening for malignant neoplasm of prostate: Secondary | ICD-10-CM | POA: Diagnosis not present

## 2017-05-21 DIAGNOSIS — I10 Essential (primary) hypertension: Secondary | ICD-10-CM | POA: Diagnosis not present

## 2017-05-21 DIAGNOSIS — E784 Other hyperlipidemia: Secondary | ICD-10-CM | POA: Diagnosis not present

## 2017-05-21 DIAGNOSIS — J3089 Other allergic rhinitis: Secondary | ICD-10-CM | POA: Diagnosis not present

## 2017-05-21 DIAGNOSIS — E662 Morbid (severe) obesity with alveolar hypoventilation: Secondary | ICD-10-CM | POA: Diagnosis not present

## 2017-05-21 DIAGNOSIS — Z6837 Body mass index (BMI) 37.0-37.9, adult: Secondary | ICD-10-CM | POA: Diagnosis not present

## 2017-05-21 LAB — BASIC METABOLIC PANEL
BUN: 10 (ref 4–21)
Sodium: 143 (ref 137–147)

## 2017-09-25 DIAGNOSIS — E7849 Other hyperlipidemia: Secondary | ICD-10-CM | POA: Diagnosis not present

## 2017-09-25 DIAGNOSIS — E662 Morbid (severe) obesity with alveolar hypoventilation: Secondary | ICD-10-CM | POA: Diagnosis not present

## 2017-09-25 DIAGNOSIS — J3089 Other allergic rhinitis: Secondary | ICD-10-CM | POA: Diagnosis not present

## 2017-09-25 DIAGNOSIS — Z125 Encounter for screening for malignant neoplasm of prostate: Secondary | ICD-10-CM | POA: Diagnosis not present

## 2017-09-25 DIAGNOSIS — Z6838 Body mass index (BMI) 38.0-38.9, adult: Secondary | ICD-10-CM | POA: Diagnosis not present

## 2017-09-25 DIAGNOSIS — I10 Essential (primary) hypertension: Secondary | ICD-10-CM | POA: Diagnosis not present

## 2017-09-25 LAB — PSA: PSA: 0.9

## 2017-10-20 ENCOUNTER — Telehealth: Payer: Self-pay | Admitting: Family Medicine

## 2017-10-28 NOTE — Telephone Encounter (Signed)
508 days old. Chart closed

## 2017-12-28 DIAGNOSIS — E7849 Other hyperlipidemia: Secondary | ICD-10-CM | POA: Diagnosis not present

## 2017-12-28 DIAGNOSIS — I10 Essential (primary) hypertension: Secondary | ICD-10-CM | POA: Diagnosis not present

## 2017-12-28 DIAGNOSIS — J3089 Other allergic rhinitis: Secondary | ICD-10-CM | POA: Diagnosis not present

## 2017-12-28 DIAGNOSIS — E662 Morbid (severe) obesity with alveolar hypoventilation: Secondary | ICD-10-CM | POA: Diagnosis not present

## 2018-04-09 ENCOUNTER — Ambulatory Visit: Payer: BLUE CROSS/BLUE SHIELD | Admitting: Family Medicine

## 2018-04-13 ENCOUNTER — Encounter: Payer: Self-pay | Admitting: Family Medicine

## 2018-04-27 ENCOUNTER — Other Ambulatory Visit: Payer: BLUE CROSS/BLUE SHIELD

## 2018-04-27 ENCOUNTER — Encounter: Payer: Self-pay | Admitting: Family Medicine

## 2018-04-27 ENCOUNTER — Ambulatory Visit (INDEPENDENT_AMBULATORY_CARE_PROVIDER_SITE_OTHER): Payer: BLUE CROSS/BLUE SHIELD | Admitting: Family Medicine

## 2018-04-27 VITALS — BP 142/78 | HR 67 | Temp 98.7°F | Ht 66.0 in | Wt 200.8 lb

## 2018-04-27 DIAGNOSIS — Z125 Encounter for screening for malignant neoplasm of prostate: Secondary | ICD-10-CM | POA: Diagnosis not present

## 2018-04-27 DIAGNOSIS — Z114 Encounter for screening for human immunodeficiency virus [HIV]: Secondary | ICD-10-CM

## 2018-04-27 DIAGNOSIS — Z1159 Encounter for screening for other viral diseases: Secondary | ICD-10-CM | POA: Diagnosis not present

## 2018-04-27 DIAGNOSIS — I10 Essential (primary) hypertension: Secondary | ICD-10-CM | POA: Diagnosis not present

## 2018-04-27 DIAGNOSIS — Z13220 Encounter for screening for lipoid disorders: Secondary | ICD-10-CM | POA: Diagnosis not present

## 2018-04-27 DIAGNOSIS — Z0001 Encounter for general adult medical examination with abnormal findings: Secondary | ICD-10-CM

## 2018-04-27 DIAGNOSIS — R059 Cough, unspecified: Secondary | ICD-10-CM

## 2018-04-27 DIAGNOSIS — R05 Cough: Secondary | ICD-10-CM

## 2018-04-27 DIAGNOSIS — N529 Male erectile dysfunction, unspecified: Secondary | ICD-10-CM

## 2018-04-27 DIAGNOSIS — R739 Hyperglycemia, unspecified: Secondary | ICD-10-CM

## 2018-04-27 DIAGNOSIS — Z122 Encounter for screening for malignant neoplasm of respiratory organs: Secondary | ICD-10-CM

## 2018-04-27 DIAGNOSIS — Z87891 Personal history of nicotine dependence: Secondary | ICD-10-CM

## 2018-04-27 DIAGNOSIS — L301 Dyshidrosis [pompholyx]: Secondary | ICD-10-CM

## 2018-04-27 LAB — CBC
HEMATOCRIT: 46 % (ref 39.0–52.0)
Hemoglobin: 15.9 g/dL (ref 13.0–17.0)
MCHC: 34.6 g/dL (ref 30.0–36.0)
MCV: 83.8 fl (ref 78.0–100.0)
Platelets: 239 10*3/uL (ref 150.0–400.0)
RBC: 5.48 Mil/uL (ref 4.22–5.81)
RDW: 13.4 % (ref 11.5–15.5)
WBC: 9.1 10*3/uL (ref 4.0–10.5)

## 2018-04-27 LAB — LIPID PANEL
Cholesterol: 234 mg/dL — ABNORMAL HIGH (ref 0–200)
HDL: 36 mg/dL — ABNORMAL LOW (ref >39.00)
Total CHOL/HDL Ratio: 7

## 2018-04-27 LAB — COMPREHENSIVE METABOLIC PANEL
ALK PHOS: 71 U/L (ref 39–117)
ALT: 29 U/L (ref 0–53)
AST: 15 U/L (ref 0–37)
Albumin: 4.4 g/dL (ref 3.5–5.2)
BILIRUBIN TOTAL: 0.5 mg/dL (ref 0.2–1.2)
BUN: 16 mg/dL (ref 6–23)
CO2: 24 mEq/L (ref 19–32)
CREATININE: 0.85 mg/dL (ref 0.40–1.50)
Calcium: 10.2 mg/dL (ref 8.4–10.5)
Chloride: 97 mEq/L (ref 96–112)
GFR: 118.91 mL/min (ref >60.00)
GLUCOSE: 442 mg/dL — AB (ref 70–99)
Potassium: 4.5 mEq/L (ref 3.5–5.1)
SODIUM: 132 meq/L — AB (ref 135–145)
TOTAL PROTEIN: 7.2 g/dL (ref 6.0–8.3)

## 2018-04-27 LAB — LDL CHOLESTEROL, DIRECT: Direct LDL: 140 mg/dL

## 2018-04-27 LAB — PSA: PSA: 0.72 ng/mL (ref 0.10–4.00)

## 2018-04-27 MED ORDER — HYDROCHLOROTHIAZIDE 12.5 MG PO TABS: 12.5000 mg | ORAL_TABLET | Freq: Every day | ORAL | 3 refills | Status: DC

## 2018-04-27 MED ORDER — SILDENAFIL CITRATE 100 MG PO TABS: 50.0000 mg | ORAL_TABLET | Freq: Every day | ORAL | 11 refills | Status: DC | PRN

## 2018-04-27 MED ORDER — AMLODIPINE BESYLATE 5 MG PO TABS: 5.0000 mg | ORAL_TABLET | Freq: Every day | ORAL | 3 refills | Status: DC

## 2018-04-27 NOTE — Progress Notes (Signed)
Subjective:  Austin Rush is a 57 y.o. male who presents today for his annual comprehensive physical exam.    HPI:  He has no acute complaints today.   He has several chronic, stable conditions outlined below: 1.  Hypertension.  Several year history.  On Norvasc 5 mg and HCTZ 12.5 mg daily.  Blood pressure typically well controlled.  No reported side effects. 2.  Hyperlipidemia.  Several year history.  Not currently on any medications. 3.  Erectile dysfunction.  Several year history.  Has been on sildenafil and tadalafil in the past with modest improvement.  Would like to retry Viagra today. 4.  Cough.  Severe history.  Improved after stopping smoking.  No particularly bothersome to patient. 5. Rash. Located on bottom of foot. Several year history. Has tried lotion without improvement.   Lifestyle Diet: Eating a balanced diet.  Exercise: Recently joined a gym. Very active at work and at home.   Depression screen PHQ 2/9 04/27/2018  Decreased Interest 0  Down, Depressed, Hopeless 0  PHQ - 2 Score 0   Health Maintenance Due  Topic Date Due  . Hepatitis C Screening  29-May-1961  . TETANUS/TDAP  12/12/1978  . INFLUENZA VACCINE  04/22/2018    ROS: Per HPI, otherwise a complete review of systems was negative.   PMH:  The following were reviewed and entered/updated in epic: Past Medical History:  Diagnosis Date  . Hyperlipidemia   . Hypertension    Patient Active Problem List   Diagnosis Date Noted  . Former smoker - Candidate for yearly CT 04/27/2018  . Dyshydrosis 04/27/2018  . Cough 04/27/2018  . Erectile dysfunction 05/19/2016  . Obesity (BMI 35.0-39.9 without comorbidity) 11/09/2015  . Vitamin D deficiency 12/29/2014  . Essential hypertension 12/29/2014   Past Surgical History:  Procedure Laterality Date  . APPENDECTOMY    . COLONOSCOPY N/A 02/22/2016   Procedure: COLONOSCOPY;  Surgeon: West Bali, MD;  Location: [facility];  Service: Endoscopy;  Laterality:  N/A;  1100    Family History  Problem Relation Age of Onset  . Diabetes Mother   . Cancer Father        PROSTATE  . Colon cancer Neg Hx     Medications- reviewed and updated Current Outpatient Medications  Medication Sig Dispense Refill  . amLODipine (NORVASC) 5 MG tablet Take 1 tablet (5 mg total) by mouth daily. 90 tablet 3  . aspirin EC 81 MG tablet Take 81 mg by mouth daily.    . hydrochlorothiazide (HYDRODIURIL) 12.5 MG tablet Take 1 tablet (12.5 mg total) by mouth daily. 90 tablet 3  . Multiple Vitamins-Minerals (CENTRUM SILVER PO) Take by mouth.    . Omega-3 Fatty Acids (FISH OIL) 1000 MG CPDR Take by mouth.    . sildenafil (VIAGRA) 100 MG tablet Take 0.5-1 tablets (50-100 mg total) by mouth daily as needed for erectile dysfunction. 5 tablet 11   No current facility-administered medications for this visit.     Allergies-reviewed and updated No Known Allergies  Social History   Socioeconomic History  . Marital status: Married    Spouse name: Not on file  . Number of children: Not on file  . Years of education: Not on file  . Highest education level: Not on file  Occupational History  . Not on file  Social Needs  . Financial resource strain: Not on file  . Food insecurity:    Worry: Not on file    Inability: Not on  file  . Transportation needs:    Medical: Not on file    Non-medical: Not on file  Tobacco Use  . Smoking status: Former Smoker    Last attempt to quit: 02/21/2011    Years since quitting: 7.1  . Smokeless tobacco: Former Neurosurgeon    Types: Chew  Substance and Sexual Activity  . Alcohol use: Yes    Alcohol/week: 0.0 oz    Comment: Wine about once a month  . Drug use: No  . Sexual activity: Not on file  Lifestyle  . Physical activity:    Days per week: Not on file    Minutes per session: Not on file  . Stress: Not on file  Relationships  . Social connections:    Talks on phone: Not on file    Gets together: Not on file    Attends religious  service: Not on file    Active member of club or organization: Not on file    Attends meetings of clubs or organizations: Not on file    Relationship status: Not on file  Other Topics Concern  . Not on file  Social History Narrative  . Not on file    Objective:  Physical Exam: BP (!) 142/78 (BP Location: Left Arm, Patient Position: Sitting, Cuff Size: Normal)   Pulse 67   Temp 98.7 F (37.1 C) (Oral)   Ht 5\' 6"  (1.676 m)   Wt 200 lb 12.8 oz (91.1 kg)   SpO2 95%   BMI 32.41 kg/m   Body mass index is 32.41 kg/m. Wt Readings from Last 3 Encounters:  04/27/18 200 lb 12.8 oz (91.1 kg)  12/04/16 235 lb 2 oz (106.7 kg)  05/19/16 238 lb (108 kg)   Gen: NAD, resting comfortably HEENT: TMs normal bilaterally. OP clear. No thyromegaly noted.  CV: RRR with no murmurs appreciated Pulm: NWOB, CTAB with no crackles, wheezes, or rhonchi GI: Normal bowel sounds present. Soft, Nontender, Nondistended. MSK: no edema, cyanosis, or clubbing noted. Eczematous rash noted to bottom of feet bilaterally.  Skin: warm, dry Neuro: CN2-12 grossly intact. Strength 5/5 in upper and lower extremities. Reflexes symmetric and intact bilaterally.  Psych: Normal affect and thought content  Assessment/Plan:  Former smoker - Candidate for yearly CT Will order low-dose CT scan.  Essential hypertension Slightly above goal today.  Continue amlodipine 5 mg daily and HCTZ 12.5 mg daily.  He will continue monitoring at home and let me know if persistently elevated to 140/90 or higher.  Erectile dysfunction Stable.  Refilled sildenafil.  Consider referral to urology if no improvement.  Dyshydrosis Stable.  Recommended over-the-counter cortisone cream as needed.  If no improvement, would consider increasing strength to triamcinolone or clobetasol.  Cough Unclear etiology.  Possibly related to underlying postnasal drip or reflux.  We will check CT scan to screen for lung cancer as noted above.  Consider trial of  antihistamine or H2 blocker if no improvement.   Preventative Healthcare: Check lipid panel, HIV antibody, hepatitis C antibody, CBC, CMET, PSA, and will order low-dose CT chest for lung cancer screening.  Patient Counseling(The following topics were reviewed and/or handout was given):  -Nutrition: Stressed importance of moderation in sodium/caffeine intake, saturated fat and cholesterol, caloric balance, sufficient intake of fresh fruits, vegetables, and fiber.  -Stressed the importance of regular exercise.   -Substance Abuse: Discussed cessation/primary prevention of tobacco, alcohol, or other drug use; driving or other dangerous activities under the influence; availability of treatment for abuse.   -  Injury prevention: Discussed safety belts, safety helmets, smoke detector, smoking near bedding or upholstery.   -Sexuality: Discussed sexually transmitted diseases, partner selection, use of condoms, avoidance of unintended pregnancy and contraceptive alternatives.   -Dental health: Discussed importance of regular tooth brushing, flossing, and dental visits.  -Health maintenance and immunizations reviewed. Please refer to Health maintenance section.  Return to care in 1 year for next preventative visit.   Katina Degreealeb M. Jimmey RalphParker, MD 04/27/2018 11:19 AM

## 2018-04-27 NOTE — Assessment & Plan Note (Signed)
Unclear etiology.  Possibly related to underlying postnasal drip or reflux.  We will check CT scan to screen for lung cancer as noted above.  Consider trial of antihistamine or H2 blocker if no improvement.

## 2018-04-27 NOTE — Assessment & Plan Note (Signed)
Stable.  Recommended over-the-counter cortisone cream as needed.  If no improvement, would consider increasing strength to triamcinolone or clobetasol.

## 2018-04-27 NOTE — Patient Instructions (Signed)
It was very nice to see you today!  You have done a great job losing weight.  Keep up the good work!  We will check blood work today.  I will refill your blood pressure medication today.  Please keep a close eye on your blood pressure and let me know if it is persistently 140/90 or higher.  I will send in a prescription for sildenafil 100 mg.  Please let me know if this does not work.  You are due for lung cancer screening.  This is done by a CT scan.  Please check with your insurance company to make sure this is covered.  This is considered a preventative service.  Come back to see me in 1 year, or sooner as needed.  Take care, Dr Jerline Pain   Preventive Care 40-64 Years, Male Preventive care refers to lifestyle choices and visits with your health care provider that can promote health and wellness. What does preventive care include?  A yearly physical exam. This is also called an annual well check.  Dental exams once or twice a year.  Routine eye exams. Ask your health care provider how often you should have your eyes checked.  Personal lifestyle choices, including: ? Daily care of your teeth and gums. ? Regular physical activity. ? Eating a healthy diet. ? Avoiding tobacco and drug use. ? Limiting alcohol use. ? Practicing safe sex. ? Taking low-dose aspirin every day starting at age 62. What happens during an annual well check? The services and screenings done by your health care provider during your annual well check will depend on your age, overall health, lifestyle risk factors, and family history of disease. Counseling Your health care provider may ask you questions about your:  Alcohol use.  Tobacco use.  Drug use.  Emotional well-being.  Home and relationship well-being.  Sexual activity.  Eating habits.  Work and work Statistician.  Screening You may have the following tests or measurements:  Height, weight, and BMI.  Blood pressure.  Lipid and  cholesterol levels. These may be checked every 5 years, or more frequently if you are over 17 years old.  Skin check.  Lung cancer screening. You may have this screening every year starting at age 88 if you have a 30-pack-year history of smoking and currently smoke or have quit within the past 15 years.  Fecal occult blood test (FOBT) of the stool. You may have this test every year starting at age 84.  Flexible sigmoidoscopy or colonoscopy. You may have a sigmoidoscopy every 5 years or a colonoscopy every 10 years starting at age 43.  Prostate cancer screening. Recommendations will vary depending on your family history and other risks.  Hepatitis C blood test.  Hepatitis B blood test.  Sexually transmitted disease (STD) testing.  Diabetes screening. This is done by checking your blood sugar (glucose) after you have not eaten for a while (fasting). You may have this done every 1-3 years.  Discuss your test results, treatment options, and if necessary, the need for more tests with your health care provider. Vaccines Your health care provider may recommend certain vaccines, such as:  Influenza vaccine. This is recommended every year.  Tetanus, diphtheria, and acellular pertussis (Tdap, Td) vaccine. You may need a Td booster every 10 years.  Varicella vaccine. You may need this if you have not been vaccinated.  Zoster vaccine. You may need this after age 21.  Measles, mumps, and rubella (MMR) vaccine. You may need at least one  dose of MMR if you were born in 1957 or later. You may also need a second dose.  Pneumococcal 13-valent conjugate (PCV13) vaccine. You may need this if you have certain conditions and have not been vaccinated.  Pneumococcal polysaccharide (PPSV23) vaccine. You may need one or two doses if you smoke cigarettes or if you have certain conditions.  Meningococcal vaccine. You may need this if you have certain conditions.  Hepatitis A vaccine. You may need this if  you have certain conditions or if you travel or work in places where you may be exposed to hepatitis A.  Hepatitis B vaccine. You may need this if you have certain conditions or if you travel or work in places where you may be exposed to hepatitis B.  Haemophilus influenzae type b (Hib) vaccine. You may need this if you have certain risk factors.  Talk to your health care provider about which screenings and vaccines you need and how often you need them. This information is not intended to replace advice given to you by your health care provider. Make sure you discuss any questions you have with your health care provider. Document Released: 10/05/2015 Document Revised: 05/28/2016 Document Reviewed: 07/10/2015 Elsevier Interactive Patient Education  [employer].

## 2018-04-27 NOTE — Assessment & Plan Note (Signed)
Will order low-dose CT scan.

## 2018-04-27 NOTE — Assessment & Plan Note (Signed)
Stable.  Refilled sildenafil.  Consider referral to urology if no improvement.

## 2018-04-27 NOTE — Assessment & Plan Note (Signed)
Slightly above goal today.  Continue amlodipine 5 mg daily and HCTZ 12.5 mg daily.  He will continue monitoring at home and let me know if persistently elevated to 140/90 or higher.

## 2018-04-28 LAB — HEPATITIS C ANTIBODY
Hepatitis C Ab: NONREACTIVE
SIGNAL TO CUT-OFF: 0.03 (ref <1.00)

## 2018-04-28 LAB — HEMOGLOBIN A1C: Hgb A1c MFr Bld: >14 % of total Hgb — ABNORMAL HIGH (ref <5.7)

## 2018-04-28 LAB — HIV ANTIBODY (ROUTINE TESTING W REFLEX): HIV 1&2 Ab, 4th Generation: NONREACTIVE

## 2018-04-28 NOTE — Progress Notes (Signed)
Can we ask the patient to come in to discuss his labs?  His blood sugar and cholesterol levels are very high and we need to discuss treatment options. Would like for him to come back ASAP. Ok to schedule with another provider if needed.  Katina Degreealeb M. Jimmey RalphParker, MD 04/28/2018 8:45 AM

## 2018-04-29 NOTE — Progress Notes (Signed)
Please inform patient of the following:  His HIV and hepatitis C tests are negative. He needs to follow up ASAP to discuss his blood sugar and cholesterol.   Katina Degreealeb M. Jimmey RalphParker, MD 04/29/2018 1:14 PM

## 2018-05-13 ENCOUNTER — Encounter: Payer: Self-pay | Admitting: Family Medicine

## 2018-05-13 ENCOUNTER — Ambulatory Visit (INDEPENDENT_AMBULATORY_CARE_PROVIDER_SITE_OTHER): Payer: BLUE CROSS/BLUE SHIELD | Admitting: Family Medicine

## 2018-05-13 VITALS — BP 138/78 | HR 76 | Temp 98.4°F | Ht 66.0 in | Wt 200.6 lb

## 2018-05-13 DIAGNOSIS — E1159 Type 2 diabetes mellitus with other circulatory complications: Secondary | ICD-10-CM

## 2018-05-13 DIAGNOSIS — E785 Hyperlipidemia, unspecified: Secondary | ICD-10-CM

## 2018-05-13 DIAGNOSIS — I152 Hypertension secondary to endocrine disorders: Secondary | ICD-10-CM

## 2018-05-13 DIAGNOSIS — E1165 Type 2 diabetes mellitus with hyperglycemia: Secondary | ICD-10-CM

## 2018-05-13 DIAGNOSIS — R739 Hyperglycemia, unspecified: Secondary | ICD-10-CM | POA: Diagnosis not present

## 2018-05-13 DIAGNOSIS — E1169 Type 2 diabetes mellitus with other specified complication: Secondary | ICD-10-CM

## 2018-05-13 DIAGNOSIS — Z794 Long term (current) use of insulin: Secondary | ICD-10-CM

## 2018-05-13 DIAGNOSIS — E119 Type 2 diabetes mellitus without complications: Secondary | ICD-10-CM | POA: Insufficient documentation

## 2018-05-13 DIAGNOSIS — I10 Essential (primary) hypertension: Secondary | ICD-10-CM

## 2018-05-13 LAB — GLUCOSE, POCT (MANUAL RESULT ENTRY): POC GLUCOSE: 453 mg/dL — AB (ref 70–99)

## 2018-05-13 MED ORDER — METFORMIN HCL ER 750 MG PO TB24: 750.0000 mg | ORAL_TABLET | Freq: Every day | ORAL | 1 refills | Status: DC

## 2018-05-13 MED ORDER — ATORVASTATIN CALCIUM 40 MG PO TABS: 40.0000 mg | ORAL_TABLET | Freq: Every day | ORAL | 3 refills | Status: DC

## 2018-05-13 MED ORDER — PEN NEEDLES 32G X 4 MM MISC: 1.0000 "application " | Freq: Every day | 0 refills | Status: AC

## 2018-05-13 MED ORDER — ACCU-CHEK GUIDE W/DEVICE KIT: 1.0000 | PACK | Freq: Every day | 0 refills | Status: AC

## 2018-05-13 MED ORDER — INSULIN DEGLUDEC 100 UNIT/ML ~~LOC~~ SOPN: 18.0000 [IU] | PEN_INJECTOR | Freq: Every day | SUBCUTANEOUS | 1 refills | Status: DC

## 2018-05-13 NOTE — Patient Instructions (Signed)
It was very nice to see you today!  Your blood sugar is very high.  Please start Tresiba 18Units daily.  Please also start the metformin and atorvastatin.  Please contact me with your fasting blood sugars every week for the next several weeks.  I would like for you to come back and see me in 4-6 weeks.  Please let me know if you would be interested in seeing our nutritionist or a diabetes educator.  Take care, Dr Jimmey RalphParker

## 2018-05-13 NOTE — Assessment & Plan Note (Signed)
At goal.  Continue HCTZ 12.5 mg daily.  Consider addition of ACE inhibitor or ARB in the near future.

## 2018-05-13 NOTE — Progress Notes (Signed)
   Subjective:  Austin Rush is a 57 y.o. male who presents today with a chief complaint of hyperglycemia.   HPI:  T2DM, new problem Patient seen 2 weeks ago for his annual physical.  Had blood work done at that time which showed significantly elevated A1c above 14 and hyperglycemia in the 400s.  Patient denies ever being told he had diabetes in the past.  He has not been on any medications for this in the past either.  Eats a lot of candy at work.  Hyperlipidemia, new problem Patient also found to have elevated LDL and low HDL on his blood work from a couple of weeks ago.  He has never been on cholesterol medication in the past either.  ROS: Per HPI  PMH: He reports that he quit smoking about 7 years ago. He has quit using smokeless tobacco.  His smokeless tobacco use included chew. He reports that he drinks alcohol. He reports that he does not use drugs.  Objective:  Physical Exam: BP 138/78 (BP Location: Left Arm, Patient Position: Sitting, Cuff Size: Normal)   Pulse 76   Temp 98.4 F (36.9 C) (Oral)   Ht 5\' 6"  (1.676 m)   Wt 200 lb 9.6 oz (91 kg)   SpO2 96%   BMI 32.38 kg/m   Gen: NAD, resting comfortably CV: RRR with no murmurs appreciated Pulm: NWOB, CTAB with no crackles, wheezes, or rhonchi  Results for orders placed or performed in visit on 05/13/18 (from the past 24 hour(s))  POCT Glucose (CBG)     Status: Abnormal   Collection Time: 05/13/18  3:42 PM  Result Value Ref Range   POC Glucose 453 (A) 70 - 99 mg/dl     Assessment/Plan:  B1YNT2DM (type 2 diabetes mellitus) (HCC) Discussed new diagnosis of diabetes with patient and his significant other today.  Stressed importance of good glycemic control.  Discussed pathogenesis and natural course of illness.  Stressed importance of lifestyle modifications as well as good compliance to medications.  Given his recent weight loss as well as extremely elevated readings, we will start insulin therapy today.  Start Tresiba 18  units daily.  We will also start metformin 750 mg daily.  Patient will check fasting blood sugars over the next week at home and contact me via either my chart or via phone with his fasting sugars for further dose titration.  He will follow-up with me in 4 to 6 weeks.  His significant other also has type 2 diabetes as well and will be a very good support/resource for him as he does with his new diagnosis.  Hypertension associated with diabetes (HCC) At goal.  Continue HCTZ 12.5 mg daily.  Consider addition of ACE inhibitor or ARB in the near future.  Hyperlipidemia associated with type 2 diabetes mellitus (HCC) Start Lipitor 40 mg daily.  Check lipid panel in 6 to 12 months.  Time Spent: I spent >25 minutes face-to-face with the patient, with more than half spent on counseling for treatment plan for his new diagnosis of diabetes.   Katina Degreealeb M. Jimmey RalphParker, MD 05/13/2018 4:50 PM

## 2018-05-13 NOTE — Assessment & Plan Note (Signed)
Start Lipitor 40 mg daily.  Check lipid panel in 6 to 12 months.

## 2018-05-13 NOTE — Assessment & Plan Note (Addendum)
Discussed new diagnosis of diabetes with patient and his significant other today.  Stressed importance of good glycemic control.  Discussed pathogenesis and natural course of illness.  Stressed importance of lifestyle modifications as well as good compliance to medications.  Given his recent weight loss as well as extremely elevated readings, we will start insulin therapy today.  Start Tresiba 18 units daily.  We will also start metformin 750 mg daily.  Patient will check fasting blood sugars over the next week at home and contact me via either my chart or via phone with his fasting sugars for further dose titration.  He will follow-up with me in 4 to 6 weeks.  His significant other also has type 2 diabetes as well and will be a very good support/resource for him as he does with his new diagnosis.

## 2018-05-26 ENCOUNTER — Encounter: Payer: Self-pay | Admitting: Physical Therapy

## 2018-06-07 ENCOUNTER — Other Ambulatory Visit: Payer: Self-pay

## 2018-06-07 MED ORDER — ACCU-CHEK SAFE-T PRO LANCETS MISC: 12 refills | Status: DC

## 2018-06-07 MED ORDER — GLUCOSE BLOOD VI STRP: ORAL_STRIP | 12 refills | Status: DC

## 2018-06-10 ENCOUNTER — Ambulatory Visit: Payer: BLUE CROSS/BLUE SHIELD | Admitting: Family Medicine

## 2018-06-10 ENCOUNTER — Other Ambulatory Visit: Payer: Self-pay

## 2018-06-10 MED ORDER — ACCU-CHEK SAFE-T PRO LANCETS MISC: 12 refills | Status: AC

## 2018-06-10 MED ORDER — GLUCOSE BLOOD VI STRP: ORAL_STRIP | 12 refills | Status: AC

## 2018-06-15 ENCOUNTER — Ambulatory Visit: Payer: BLUE CROSS/BLUE SHIELD | Admitting: Family Medicine

## 2018-06-28 ENCOUNTER — Encounter: Payer: Self-pay | Admitting: Family Medicine

## 2018-06-28 ENCOUNTER — Ambulatory Visit (INDEPENDENT_AMBULATORY_CARE_PROVIDER_SITE_OTHER): Payer: BLUE CROSS/BLUE SHIELD | Admitting: Family Medicine

## 2018-06-28 VITALS — BP 138/78 | HR 77 | Temp 98.6°F | Ht 66.0 in | Wt 203.8 lb

## 2018-06-28 DIAGNOSIS — E1165 Type 2 diabetes mellitus with hyperglycemia: Secondary | ICD-10-CM

## 2018-06-28 DIAGNOSIS — E1169 Type 2 diabetes mellitus with other specified complication: Secondary | ICD-10-CM

## 2018-06-28 DIAGNOSIS — R109 Unspecified abdominal pain: Secondary | ICD-10-CM | POA: Diagnosis not present

## 2018-06-28 DIAGNOSIS — Z23 Encounter for immunization: Secondary | ICD-10-CM

## 2018-06-28 DIAGNOSIS — Z794 Long term (current) use of insulin: Secondary | ICD-10-CM

## 2018-06-28 DIAGNOSIS — E785 Hyperlipidemia, unspecified: Secondary | ICD-10-CM

## 2018-06-28 LAB — MICROALBUMIN / CREATININE URINE RATIO
Creatinine,U: 75.5 mg/dL
MICROALB/CREAT RATIO: 0.9 mg/g (ref 0.0–30.0)
Microalb, Ur: <0.7 mg/dL (ref 0.0–1.9)

## 2018-06-28 MED ORDER — METFORMIN HCL ER 750 MG PO TB24: 1500.0000 mg | ORAL_TABLET | Freq: Every day | ORAL | 1 refills | Status: DC

## 2018-06-28 NOTE — Assessment & Plan Note (Addendum)
Doing much better per his reported home blood sugars.  We will increase his metformin to 1500 mg daily.  Continue Tresiba 18 units daily.  Follow-up in 6 weeks and we will recheck A1c at that time.  We will give pneumonia vaccine today.  Check urine microalbumin as well.

## 2018-06-28 NOTE — Progress Notes (Signed)
   Subjective:  Austin Rush is a 57 y.o. male who presents today with a chief complaint of T2DM.   HPI:  T2DM, chronic problem Patient last seen for this about 6 weeks ago.  We started him on Tresiba 18 units daily and metformin 750 mg daily.  He had a little diarrhea which is since resolved.  We will also stop drinking soda and stop eating candy.  He has been trying to go to the gym 3 times weekly.  Sugars at home have been in the 140s to 180s.  Hyperlipidemia, chronic problem, stable Patient seen also 6 weeks ago for this and started on Lipitor 40 mg daily.  He has tolerated this well without side effects.  No reported myalgias.  No reported chest pain or shortness of breath.  Abdominal pain, new problem Started a couple weeks ago.  Located left lateral abdomen.  Thinks it is related to doing more sit ups and crunches.  No pain at rest.  No nausea vomiting.  Has pain with doing sit ups and crunches.  ROS: Per HPI  PMH: He reports that he quit smoking about 7 years ago. He has quit using smokeless tobacco.  His smokeless tobacco use included chew. He reports that he drinks alcohol. He reports that he does not use drugs.   Objective:  Physical Exam: BP 138/78 (BP Location: Left Arm, Patient Position: Sitting, Cuff Size: Normal)   Pulse 77   Temp 98.6 F (37 C) (Oral)   Ht 5\' 6"  (1.676 m)   Wt 203 lb 12.8 oz (92.4 kg)   SpO2 98%   BMI 32.89 kg/m   Wt Readings from Last 3 Encounters:  06/28/18 203 lb 12.8 oz (92.4 kg)  05/13/18 200 lb 9.6 oz (91 kg)  04/27/18 200 lb 12.8 oz (91.1 kg)  Gen: NAD, resting comfortably CV: RRR with no murmurs appreciated Pulm: NWOB, CTAB with no crackles, wheezes, or rhonchi GI: Normal bowel sounds present. Soft, Nontender, Nondistended.  Assessment/Plan:  T2DM (type 2 diabetes mellitus) (HCC) Doing much better per his reported home blood sugars.  We will increase his metformin to 1500 mg daily.  Continue Tresiba 18 units daily.  Follow-up in 6  weeks and we will recheck A1c at that time.  We will give pneumonia vaccine today.  Check urine microalbumin as well.  Hyperlipidemia associated with type 2 diabetes mellitus (HCC) Tolerating Lipitor 40 mg daily well.  Repeat lipid panel in 6 to 12 months.  Abdominal pain No red flag signs or symptoms.  Abdominal exam benign.  Likely muscular strain.  Advised patient to rest for the next 1 to 2 weeks and take anti-inflammatories as needed.  Discussed reasons return to care.  Follow-up as needed.  Katina Degree. Jimmey Ralph, MD 06/28/2018 2:31 PM

## 2018-06-28 NOTE — Assessment & Plan Note (Signed)
Tolerating Lipitor 40 mg daily well.  Repeat lipid panel in 6 to 12 months.

## 2018-06-28 NOTE — Patient Instructions (Signed)
It was very nice to see you today!  I am glad that you are doing well!  Please increase your metformin to 1500 mg daily.  This is 2 pills a day.  We will continue Tresiba 18 units daily.  Please take it easy and let her abdominal muscles rest for the next couple weeks.  Can take Tylenol or ibuprofen as needed.  We will check a urine sample today.  Please come back to see me in 6 weeks to recheck your A1c, or sooner as needed.  Take care, Dr Jimmey Ralph

## 2018-06-29 NOTE — Progress Notes (Signed)
Dr Lavone Neri interpretation of your lab work:  Your urine test was NORMAL.    If you have any additional questions, please give [place] a call or send [place] a message through [place].  Take care, Dr Jimmey Ralph

## 2018-08-09 ENCOUNTER — Ambulatory Visit: Payer: BLUE CROSS/BLUE SHIELD | Admitting: Family Medicine

## 2018-08-23 ENCOUNTER — Encounter: Payer: Self-pay | Admitting: Family Medicine

## 2018-08-23 ENCOUNTER — Ambulatory Visit (INDEPENDENT_AMBULATORY_CARE_PROVIDER_SITE_OTHER): Payer: BLUE CROSS/BLUE SHIELD | Admitting: Family Medicine

## 2018-08-23 VITALS — BP 134/74 | HR 63 | Temp 98.5°F | Ht 66.0 in | Wt 210.6 lb

## 2018-08-23 DIAGNOSIS — Z794 Long term (current) use of insulin: Secondary | ICD-10-CM | POA: Diagnosis not present

## 2018-08-23 DIAGNOSIS — E1165 Type 2 diabetes mellitus with hyperglycemia: Secondary | ICD-10-CM

## 2018-08-23 DIAGNOSIS — E1159 Type 2 diabetes mellitus with other circulatory complications: Secondary | ICD-10-CM | POA: Diagnosis not present

## 2018-08-23 DIAGNOSIS — Z634 Disappearance and death of family member: Secondary | ICD-10-CM

## 2018-08-23 DIAGNOSIS — I152 Hypertension secondary to endocrine disorders: Secondary | ICD-10-CM

## 2018-08-23 DIAGNOSIS — I10 Essential (primary) hypertension: Secondary | ICD-10-CM

## 2018-08-23 LAB — POCT GLYCOSYLATED HEMOGLOBIN (HGB A1C): Hemoglobin A1C: 8.2 % — AB (ref 4.0–5.6)

## 2018-08-23 MED ORDER — METFORMIN HCL ER (MOD) 1000 MG PO TB24: 1000.0000 mg | ORAL_TABLET | Freq: Two times a day (BID) | ORAL | 3 refills | Status: DC

## 2018-08-23 MED ORDER — INSULIN DEGLUDEC 100 UNIT/ML ~~LOC~~ SOPN: 16.0000 [IU] | PEN_INJECTOR | Freq: Every day | SUBCUTANEOUS | 1 refills | Status: DC

## 2018-08-23 NOTE — Assessment & Plan Note (Signed)
Offered words of encouragement to sign.  He is not interested in pharmacotherapy or psychotherapy at this point.  Discussed reasons to return to care.

## 2018-08-23 NOTE — Assessment & Plan Note (Addendum)
A1c improved to 8.2.  Given his numerous double-digit fasting blood sugars, we will decrease his Evaristo Buryresiba to 16 units daily.  We will increase his metformin to 1000 mg twice daily.  Encouraged continued lifestyle modifications.  He will follow-up with me in 3 months.  We will try to wean off insulin as able.

## 2018-08-23 NOTE — Assessment & Plan Note (Signed)
At goal on Norvasc 5 mg daily and HCTZ 12.5 mg daily.

## 2018-08-23 NOTE — Patient Instructions (Signed)
It was very nice to see you today!  Your A1c looks much better. Keep up the good work!  I would like to start working on getting you off of insulin.  We will increase your dose of metformin to 1000 mg twice daily.  I will send in a new prescription for this dose.  Please decrease your insulin to 16 units daily.  Please let me know if you have any symptomatic lows or highs.  No other medication changes today.  Come back to see me in 3 months, or sooner as needed.  Take care, Dr Jimmey RalphParker

## 2018-08-23 NOTE — Progress Notes (Signed)
   Subjective:  Austin Rush is a 57 y.o. male who presents today with a chief complaint of T2DM.   HPI:  T2DM, chronic problem, improving Seen about 2 months ago for this.  At his last visit, we increased his metformin to 1500 mg daily.  He was also continued on Tresiba 18 units daily. He has done well since our last visit.  His blood sugars usually less than 100.  No symptomatic lows.  No reported polyuria polydipsia.  Hypertension, chronic problem, stable On Norvasc 5 mg daily and HCTZ 12.5 mg daily.  Tolerating both well.  No prior chest pain or shortness of breath.  Bereavement, chronic problem, new to provider, stable Patient's son passed away about a year ago due to drug overdose.  He has occasional sad days.  Overall feels like he is managing it well.  Has good support system at home and with his family.  ROS: Per HPI  PMH: He reports that he quit smoking about 7 years ago. He has quit using smokeless tobacco.  His smokeless tobacco use included chew. He reports that he drinks alcohol. He reports that he does not use drugs.  Objective:  Physical Exam: BP 134/74 (BP Location: Left Arm, Patient Position: Sitting, Cuff Size: Normal)   Pulse 63   Temp 98.5 F (36.9 C) (Oral)   Ht 5\' 6"  (1.676 m)   Wt 210 lb 9.6 oz (95.5 kg)   SpO2 98%   BMI 33.99 kg/m   Wt Readings from Last 3 Encounters:  08/23/18 210 lb 9.6 oz (95.5 kg)  06/28/18 203 lb 12.8 oz (92.4 kg)  05/13/18 200 lb 9.6 oz (91 kg)  Gen: NAD, resting comfortably CV: RRR with no murmurs appreciated Pulm: NWOB, CTAB with no crackles, wheezes, or rhonchi Neuro: Grossly normal, moves all extremities Psych: Normal affect and thought content  Results for orders placed or performed in visit on 08/23/18 (from the past 24 hour(s))  POCT glycosylated hemoglobin (Hb A1C)     Status: Abnormal   Collection Time: 08/23/18 10:05 AM  Result Value Ref Range   Hemoglobin A1C 8.2 (A) 4.0 - 5.6 %    Assessment/Plan:  T2DM (type 2  diabetes mellitus) (HCC) A1c improved to 8.2.  Given his numerous double-digit fasting blood sugars, we will decrease his Evaristo Buryresiba to 16 units daily.  We will increase his metformin to 1000 mg twice daily.  Encouraged continued lifestyle modifications.  He will follow-up with me in 3 months.  We will try to wean off insulin as able.  Hypertension associated with diabetes (HCC) At goal on Norvasc 5 mg daily and HCTZ 12.5 mg daily.  Bereavement Offered words of encouragement to sign.  He is not interested in pharmacotherapy or psychotherapy at this point.  Discussed reasons to return to care.  Katina Degreealeb M. Jimmey RalphParker, MD 08/23/2018 12:11 PM

## 2018-08-24 ENCOUNTER — Other Ambulatory Visit: Payer: Self-pay

## 2018-08-25 ENCOUNTER — Other Ambulatory Visit: Payer: Self-pay | Admitting: Family Medicine

## 2018-08-25 ENCOUNTER — Other Ambulatory Visit: Payer: Self-pay

## 2018-08-25 MED ORDER — METFORMIN HCL ER 500 MG PO TB24: 1000.0000 mg | ORAL_TABLET | Freq: Two times a day (BID) | ORAL | 1 refills | Status: DC

## 2018-08-25 NOTE — Telephone Encounter (Signed)
I have sent in the correct prescription.

## 2018-08-25 NOTE — Telephone Encounter (Signed)
Angie called to check on this.  States that all they received back was "ok to change" but change to what? Angie can be reached at [phone]

## 2018-08-25 NOTE — Telephone Encounter (Signed)
See note

## 2018-09-09 ENCOUNTER — Other Ambulatory Visit: Payer: Self-pay | Admitting: Family Medicine

## 2018-10-08 ENCOUNTER — Emergency Department (HOSPITAL_COMMUNITY)
Admission: EM | Admit: 2018-10-08 | Discharge: 2018-10-08 | Disposition: A | Discharge status: Home / Self Care | Payer: BLUE CROSS/BLUE SHIELD | Attending: Emergency Medicine | Admitting: Emergency Medicine

## 2018-10-08 ENCOUNTER — Emergency Department (HOSPITAL_COMMUNITY): Payer: BLUE CROSS/BLUE SHIELD

## 2018-10-08 DIAGNOSIS — R109 Unspecified abdominal pain: Secondary | ICD-10-CM | POA: Diagnosis not present

## 2018-10-08 DIAGNOSIS — D72829 Elevated white blood cell count, unspecified: Secondary | ICD-10-CM | POA: Diagnosis not present

## 2018-10-08 DIAGNOSIS — K859 Acute pancreatitis without necrosis or infection, unspecified: Secondary | ICD-10-CM | POA: Diagnosis not present

## 2018-10-08 DIAGNOSIS — I700 Atherosclerosis of aorta: Secondary | ICD-10-CM | POA: Diagnosis not present

## 2018-10-08 DIAGNOSIS — Z7982 Long term (current) use of aspirin: Secondary | ICD-10-CM | POA: Insufficient documentation

## 2018-10-08 DIAGNOSIS — E119 Type 2 diabetes mellitus without complications: Secondary | ICD-10-CM | POA: Insufficient documentation

## 2018-10-08 DIAGNOSIS — I10 Essential (primary) hypertension: Secondary | ICD-10-CM | POA: Diagnosis not present

## 2018-10-08 DIAGNOSIS — R1011 Right upper quadrant pain: Secondary | ICD-10-CM | POA: Diagnosis not present

## 2018-10-08 DIAGNOSIS — Z794 Long term (current) use of insulin: Secondary | ICD-10-CM | POA: Insufficient documentation

## 2018-10-08 DIAGNOSIS — Z6832 Body mass index (BMI) 32.0-32.9, adult: Secondary | ICD-10-CM | POA: Diagnosis not present

## 2018-10-08 DIAGNOSIS — Z87891 Personal history of nicotine dependence: Secondary | ICD-10-CM | POA: Insufficient documentation

## 2018-10-08 DIAGNOSIS — Z79899 Other long term (current) drug therapy: Secondary | ICD-10-CM | POA: Diagnosis not present

## 2018-10-08 LAB — COMPREHENSIVE METABOLIC PANEL
ALT: 34 U/L (ref 0–44)
AST: 23 U/L (ref 15–41)
Albumin: 4 g/dL (ref 3.5–5.0)
Alkaline Phosphatase: 55 U/L (ref 38–126)
Anion gap: 11 (ref 5–15)
BUN: 8 mg/dL (ref 6–20)
CO2: 23 mmol/L (ref 22–32)
Calcium: 9.6 mg/dL (ref 8.9–10.3)
Chloride: 103 mmol/L (ref 98–111)
Creatinine, Ser: 0.75 mg/dL (ref 0.61–1.24)
GFR calc Af Amer: >60 mL/min (ref >60)
GFR calc non Af Amer: >60 mL/min (ref >60)
Glucose, Bld: 152 mg/dL — ABNORMAL HIGH (ref 70–99)
Potassium: 3.8 mmol/L (ref 3.5–5.1)
Sodium: 137 mmol/L (ref 135–145)
Total Bilirubin: 0.8 mg/dL (ref 0.3–1.2)
Total Protein: 7.3 g/dL (ref 6.5–8.1)

## 2018-10-08 LAB — CBC
HCT: 45 % (ref 39.0–52.0)
Hemoglobin: 15.5 g/dL (ref 13.0–17.0)
MCH: 27.9 pg (ref 26.0–34.0)
MCHC: 34.4 g/dL (ref 30.0–36.0)
MCV: 81.1 fL (ref 80.0–100.0)
Platelets: 291 10*3/uL (ref 150–400)
RBC: 5.55 MIL/uL (ref 4.22–5.81)
RDW: 13.1 % (ref 11.5–15.5)
WBC: 12.6 10*3/uL — ABNORMAL HIGH (ref 4.0–10.5)
nRBC: 0 % (ref 0.0–0.2)

## 2018-10-08 LAB — LIPASE, BLOOD: Lipase: 693 U/L — ABNORMAL HIGH (ref 11–51)

## 2018-10-08 MED ORDER — SODIUM CHLORIDE 0.9% FLUSH: 3.0000 mL | Freq: Once | INTRAVENOUS | Status: DC

## 2018-10-08 MED ORDER — MORPHINE SULFATE (PF) 4 MG/ML IV SOLN
4.0000 mg | Freq: Once | INTRAVENOUS | Status: AC
  Administered 2018-10-08: 4 mg via INTRAVENOUS
  Filled 2018-10-08: qty 1

## 2018-10-08 MED ORDER — SODIUM CHLORIDE 0.9 % IV BOLUS
1000.0000 mL | Freq: Once | INTRAVENOUS | Status: AC
  Administered 2018-10-08: 1000 mL via INTRAVENOUS

## 2018-10-08 MED ORDER — OXYCODONE-ACETAMINOPHEN 5-325 MG PO TABS: 1.0000 | ORAL_TABLET | ORAL | 0 refills | Status: DC | PRN

## 2018-10-08 MED ORDER — IOHEXOL 300 MG/ML  SOLN
100.0000 mL | Freq: Once | INTRAMUSCULAR | Status: AC | PRN
  Administered 2018-10-08: 100 mL via INTRAVENOUS

## 2018-10-08 NOTE — ED Triage Notes (Signed)
Pt here with c/o gen abd pain . No n/v/d pt has elevated white from UC , pt has history of pancreatitis

## 2018-10-08 NOTE — ED Provider Notes (Signed)
[facility] EMERGENCY DEPARTMENT Provider Note   CSN: [id number] Arrival date & time: 10/08/18  1706     History   Chief Complaint Chief Complaint  Patient presents with  . Abdominal Pain    HPI Austin Rush is a 58 y.o. male who presents with abdominal pain.  Past medical history significant for diabetes, hypertension, hyperlipidemia.  The patient states that he has had abdominal pain for "a week and 3 days".  The pain is in the epigastric and periumbilical area.  It is sharp and constant.  He thought it would go away but has not so he went to his doctor and they referred him to the emergency department because he had a leukocytosis.  He denies fever, chills, chest pain, shortness of breath, nausea, vomiting, diarrhea, constipation, urinary symptoms.  He denies alcohol use.  He states he had pancreatitis in the past which was due to alcohol use but he does not drink anymore.  He denies any problems with his gallbladder.  HPI  Past Medical History:  Diagnosis Date  . Hyperlipidemia   . Hypertension     Patient Active Problem List   Diagnosis Date Noted  . Bereavement 08/23/2018  . T2DM (type 2 diabetes mellitus) ([place]) 05/13/2018  . Hyperlipidemia associated with type 2 diabetes mellitus ([place]) 05/13/2018  . Former smoker - Candidate for yearly CT 04/27/2018  . Dyshydrosis 04/27/2018  . Cough 04/27/2018  . Erectile dysfunction 05/19/2016  . Vitamin D deficiency 12/29/2014  . Hypertension associated with diabetes ([place]) 12/29/2014    Past Surgical History:  Procedure Laterality Date  . APPENDECTOMY    . COLONOSCOPY N/A 02/22/2016   Procedure: COLONOSCOPY;  Surgeon: Danie Binder, MD;  Location: [facility];  Service: Endoscopy;  Laterality: N/A;  1100        Home Medications    Prior to Admission medications   Medication Sig Start Date End Date Taking? Authorizing Provider  amLODipine (NORVASC) 5 MG tablet Take 1 tablet (5 mg total) by mouth daily.  04/27/18   Vivi Barrack, MD  aspirin EC 81 MG tablet Take 81 mg by mouth daily.    [provider]  atorvastatin (LIPITOR) 40 MG tablet Take 1 tablet (40 mg total) by mouth daily. 05/13/18   Vivi Barrack, MD  Blood Glucose Monitoring Suppl (ACCU-CHEK GUIDE) w/Device KIT 1 kit by Does not apply route daily. Use daily as needed to check blood sugar. 05/13/18   Vivi Barrack, MD  glucose blood (ACCU-CHEK GUIDE) test strip Use twice daily as needed to check blood sugar. 06/10/18   Vivi Barrack, MD  hydrochlorothiazide (HYDRODIURIL) 12.5 MG tablet Take 1 tablet (12.5 mg total) by mouth daily. 04/27/18   Vivi Barrack, MD  insulin degludec (TRESIBA FLEXTOUCH) 100 UNIT/ML SOPN FlexTouch Pen Inject 0.16 mLs (16 Units total) into the skin daily. 08/23/18   Vivi Barrack, MD  Insulin Pen Needle (PEN NEEDLES) 32G X 4 MM MISC 1 application by Does not apply route daily. Use daily to inject insulin. 05/13/18   Vivi Barrack, MD  Lancets (ACCU-CHEK SAFE-T PRO) lancets Use twice daily as needed to check blood sugar 06/10/18   Vivi Barrack, MD  metFORMIN (GLUCOPHAGE-XR) 500 MG 24 hr tablet Take 2 tablets (1,000 mg total) by mouth 2 (two) times daily. 08/25/18   Vivi Barrack, MD  metFORMIN (GLUCOPHAGE-XR) 750 MG 24 hr tablet TAKE 1 TABLET BY MOUTH ONCE DAILY WITH BREAKFAST 09/09/18  Vivi Barrack, MD  Multiple Vitamins-Minerals (CENTRUM SILVER PO) Take by mouth.    [provider]  Omega-3 Fatty Acids (FISH OIL) 1000 MG CPDR Take by mouth.    [provider]  sildenafil (VIAGRA) 100 MG tablet Take 0.5-1 tablets (50-100 mg total) by mouth daily as needed for erectile dysfunction. 04/27/18   Vivi Barrack, MD    Family History Family History  Problem Relation Age of Onset  . Diabetes Mother   . Cancer Father        PROSTATE  . Colon cancer Neg Hx     Social History Social History   Tobacco Use  . Smoking status: Former Smoker    Last attempt to quit: 02/21/2011     Years since quitting: 7.6  . Smokeless tobacco: Former Systems developer    Types: Chew  Substance Use Topics  . Alcohol use: Yes    Alcohol/week: 0.0 standard drinks    Comment: Wine about once a month  . Drug use: No     Allergies   Patient has no known allergies.   Review of Systems Review of Systems  Constitutional: Negative for fever.  Respiratory: Negative for shortness of breath.   Cardiovascular: Negative for chest pain.  Gastrointestinal: Positive for abdominal pain. Negative for diarrhea, nausea and vomiting.  Genitourinary: Negative for dysuria.  All other systems reviewed and are negative.    Physical Exam Updated Vital Signs BP (!) 159/100 (BP Location: Right Arm)   Pulse 77   Temp 98.4 F (36.9 C) (Oral)   Resp 18   SpO2 100%   Physical Exam Vitals signs and nursing note reviewed.  Constitutional:      General: He is not in acute distress.    Appearance: He is well-developed.     Comments: Calm and cooperative.  No acute distress  HENT:     Head: Normocephalic and atraumatic.  Eyes:     General: No scleral icterus.       Right eye: No discharge.        Left eye: No discharge.     Conjunctiva/sclera: Conjunctivae normal.     Pupils: Pupils are equal, round, and reactive to light.  Neck:     Musculoskeletal: Normal range of motion.  Cardiovascular:     Rate and Rhythm: Normal rate.  Pulmonary:     Effort: Pulmonary effort is normal. No respiratory distress.  Abdominal:     General: Abdomen is protuberant. Bowel sounds are normal. There is no distension.     Palpations: Abdomen is soft.     Tenderness: There is abdominal tenderness in the right upper quadrant, epigastric area, periumbilical area and left upper quadrant.  Skin:    General: Skin is warm and dry.  Neurological:     Mental Status: He is alert and oriented to person, place, and time.  Psychiatric:        Behavior: Behavior normal.      ED Treatments / Results  Labs (all labs ordered are  listed, but only abnormal results are displayed) Labs Reviewed  LIPASE, BLOOD - Abnormal; Notable for the following components:      Result Value   Lipase 693 (*)    All other components within normal limits  COMPREHENSIVE METABOLIC PANEL - Abnormal; Notable for the following components:   Glucose, Bld 152 (*)    All other components within normal limits  CBC - Abnormal; Notable for the following components:   WBC 12.6 (*)  All other components within normal limits    EKG None  Radiology Ct Abdomen Pelvis W Contrast  Result Date: 10/08/2018 CLINICAL DATA:  Acute abdominal pain and leukocytosis. EXAM: CT ABDOMEN AND PELVIS WITH CONTRAST TECHNIQUE: Multidetector CT imaging of the abdomen and pelvis was performed using the standard protocol following bolus administration of intravenous contrast. CONTRAST:  116m OMNIPAQUE IOHEXOL 300 MG/ML  SOLN COMPARISON:  None. FINDINGS: LOWER CHEST: There is no basilar pleural or apical pericardial effusion. HEPATOBILIARY: The hepatic contours and density are normal. There is no intra- or extrahepatic biliary dilatation. There is a cyst at the hepatic dome measuring 13 mm. The gallbladder is normal. PANCREAS: There is mild inflammatory stranding surrounding the body of the pancreas. No peripancreatic fluid collection. Normal pancreatic enhancement. SPLEEN: Normal. ADRENALS/URINARY TRACT: --Adrenal glands: Normal. --Right kidney/ureter: No hydronephrosis, nephroureterolithiasis, perinephric stranding or solid renal mass. --Left kidney/ureter: No hydronephrosis, nephroureterolithiasis, perinephric stranding or solid renal mass. --Urinary bladder: Normal for degree of distention STOMACH/BOWEL: --Stomach/Duodenum: There is no hiatal hernia or other gastric abnormality. The duodenal course and caliber are normal. --Small bowel: No dilatation or inflammation. --Colon: No focal abnormality. --Appendix: Surgically absent. VASCULAR/LYMPHATIC: There is aortic  atherosclerosis without hemodynamically significant stenosis. The portal vein, splenic vein, superior mesenteric vein and IVC are patent. No abdominal or pelvic lymphadenopathy. REPRODUCTIVE: Normal prostate size with symmetric seminal vesicles. MUSCULOSKELETAL. No bony spinal canal stenosis or focal osseous abnormality. OTHER: None. IMPRESSION: 1. Mild inflammatory stranding surrounding the body of the pancreas, consistent with acute pancreatitis. No peripancreatic fluid collection or evidence of necrosis. 2.  Aortic atherosclerosis (ICD10-I70.0). Electronically Signed   By: KUlyses JarredM.D.   On: 10/08/2018 22:19    Procedures Procedures (including critical care time)  Medications Ordered in ED Medications  sodium chloride flush (NS) 0.9 % injection 3 mL (has no administration in time range)  morphine 4 MG/ML injection 4 mg (4 mg Intravenous Given 10/08/18 2138)  sodium chloride 0.9 % bolus 1,000 mL (0 mLs Intravenous Stopped 10/08/18 2238)  iohexol (OMNIPAQUE) 300 MG/ML solution 100 mL (100 mLs Intravenous Contrast Given 10/08/18 2150)     Initial Impression / Assessment and Plan / ED Course  I have reviewed the triage vital signs and the nursing notes.  Pertinent labs & imaging results that were available during my care of the patient were reviewed by me and considered in my medical decision making (see chart for details).  58year old male presents with abdominal pain for a week.  He is hypertensive but otherwise vital signs are normal.  His lipase is elevated here to 693.  He has a mild leukocytosis.  His LFTs are normal.  He denies any alcohol use.  Will obtain CT of the abdomen.  CT shows mild uncomplicated pancreatitis.  It is possible his metformin is causing pancreatitis.  Ranson score is 1.  He feels much better after fluids and morphine.  He tolerated water here.  He was given prescription for Percocet and advised to follow-up with his doctor.  He was instructed to follow a clear  liquid diet for the next several days.  He was given return precautions.  Final Clinical Impressions(s) / ED Diagnoses   Final diagnoses:  Acute pancreatitis, unspecified complication status, unspecified pancreatitis type    ED Discharge Orders    None       GRecardo Evangelist PA-C 10/08/18 2342    KDorie Rank MD 10/09/18 0002

## 2018-10-08 NOTE — Discharge Instructions (Signed)
Take Percocet as needed for severe pain  Follow a clear liquid diet for the next 2-3 days Please return if worsening

## 2018-11-22 ENCOUNTER — Encounter: Payer: Self-pay | Admitting: Family Medicine

## 2018-11-22 ENCOUNTER — Ambulatory Visit (INDEPENDENT_AMBULATORY_CARE_PROVIDER_SITE_OTHER): Payer: BLUE CROSS/BLUE SHIELD | Admitting: Family Medicine

## 2018-11-22 VITALS — BP 138/72 | HR 77 | Temp 98.2°F | Ht 66.0 in | Wt 219.4 lb

## 2018-11-22 DIAGNOSIS — E1169 Type 2 diabetes mellitus with other specified complication: Secondary | ICD-10-CM | POA: Diagnosis not present

## 2018-11-22 DIAGNOSIS — Z794 Long term (current) use of insulin: Secondary | ICD-10-CM

## 2018-11-22 DIAGNOSIS — E1165 Type 2 diabetes mellitus with hyperglycemia: Secondary | ICD-10-CM | POA: Diagnosis not present

## 2018-11-22 DIAGNOSIS — Z6835 Body mass index (BMI) 35.0-35.9, adult: Secondary | ICD-10-CM | POA: Diagnosis not present

## 2018-11-22 DIAGNOSIS — E1159 Type 2 diabetes mellitus with other circulatory complications: Secondary | ICD-10-CM

## 2018-11-22 DIAGNOSIS — E785 Hyperlipidemia, unspecified: Secondary | ICD-10-CM

## 2018-11-22 DIAGNOSIS — I152 Hypertension secondary to endocrine disorders: Secondary | ICD-10-CM

## 2018-11-22 DIAGNOSIS — I10 Essential (primary) hypertension: Secondary | ICD-10-CM

## 2018-11-22 LAB — POCT GLYCOSYLATED HEMOGLOBIN (HGB A1C): HEMOGLOBIN A1C: 8.3 % — AB (ref 4.0–5.6)

## 2018-11-22 MED ORDER — CANAGLIFLOZIN 100 MG PO TABS: 100.0000 mg | ORAL_TABLET | Freq: Every day | ORAL | 2 refills | Status: DC

## 2018-11-22 NOTE — Patient Instructions (Signed)
It was very nice to see you today!  Please start the Invokana.  Please take 1 pill every morning.  This could cause increased urination.  This was normal.  Please let me know if this is bothersome if you have any other side effects.  Please continue working on diet and exercise.  Come back to see me in 3 months to recheck your blood sugar, or sooner as needed.  Take care, Dr Jimmey Ralph

## 2018-11-22 NOTE — Progress Notes (Signed)
   Chief Complaint:  Austin Rush is a 58 y.o. male who presents today with a chief complaint of diabetes follow-up.   Assessment/Plan:  T2DM (type 2 diabetes mellitus) (HCC) A1c stable at 8.3 however still uncontrolled.  He is up about 16 pounds over the last 6 months.  Discussed importance of weight loss, healthy/low carb diet, and regular exercise.  We will continue metformin 1000 mg twice daily and Tresiba 14 units daily.  Will start Invokana 100 mg daily today.  Follow-up with me in 3 months to recheck A1c.Marland Kitchen  He recently had a bout of unexplained pancreatitis.  In light of this, we will avoid DPP 4 inhibitors and GLP 1 agonist for the near future.  Hypertension associated with diabetes (HCC) At goal.  Continue Norvasc 5 mg daily and HCTZ 12.5 mg daily.  Hyperlipidemia associated with type 2 diabetes mellitus (HCC) Stable.  Continue Lipitor 40 mg daily.  BMI 35 Discussed lifestyle modifications.    Subjective:  HPI:  His chronic medical conditions are outlined below:  # T2DM  - On metformin 1000mg  twice daily and tresiba 14 units daily.  Tolerating both without side effects. -ROS: No reported polyuria or  polydipsia.  # Essential hypertension - On Norvasc 5mg  daily and HCTZ 12.5mg  daily and tolerating well - ROS: No reported chest pain or shortness of breath  # Dyslipidemia - On lipitor 40mg  daily and tolerating well - ROS: No reported myalgias  ROS: Per HPI  PMH: He reports that he quit smoking about 7 years ago. He has quit using smokeless tobacco.  His smokeless tobacco use included chew. He reports current alcohol use. He reports that he does not use drugs.      Objective:  Physical Exam: BP 138/72 (BP Location: Left Arm, Patient Position: Sitting, Cuff Size: Large)   Pulse 77   Temp 98.2 F (36.8 C) (Oral)   Ht 5\' 6"  (1.676 m)   Wt 219 lb 6.1 oz (99.5 kg)   SpO2 96%   BMI 35.41 kg/m   Wt Readings from Last 3 Encounters:  11/22/18 219 lb 6.1 oz (99.5  kg)  08/23/18 210 lb 9.6 oz (95.5 kg)  06/28/18 203 lb 12.8 oz (92.4 kg)  Gen: NAD, resting comfortably CV: Regular rate and rhythm with no murmurs appreciated Pulm: Normal work of breathing, clear to auscultation bilaterally with no crackles, wheezes, or rhonchi  Results for orders placed or performed in visit on 11/22/18 (from the past 24 hour(s))  POCT HgB A1C     Status: Abnormal   Collection Time: 11/22/18  9:21 AM  Result Value Ref Range   Hemoglobin A1C 8.3 (A) 4.0 - 5.6 %        Corrigan Kretschmer M. Jimmey Ralph, MD 11/22/2018 9:46 AM

## 2018-11-22 NOTE — Assessment & Plan Note (Signed)
At goal.  Continue Norvasc 5 mg daily and HCTZ 12.5 mg daily. 

## 2018-11-22 NOTE — Assessment & Plan Note (Signed)
Stable. Continue Lipitor 40 mg daily.

## 2018-11-22 NOTE — Assessment & Plan Note (Addendum)
A1c stable at 8.3 however still uncontrolled.  He is up about 16 pounds over the last 6 months.  Discussed importance of weight loss, healthy/low carb diet, and regular exercise.  We will continue metformin 1000 mg twice daily and Tresiba 14 units daily.  Will start Invokana 100 mg daily today.  Follow-up with me in 3 months to recheck A1c.Marland Kitchen  He recently had a bout of unexplained pancreatitis.  In light of this, we will avoid DPP 4 inhibitors and GLP 1 agonist for the near future.

## 2019-02-22 ENCOUNTER — Ambulatory Visit: Payer: BLUE CROSS/BLUE SHIELD | Admitting: Family Medicine

## 2019-02-22 ENCOUNTER — Other Ambulatory Visit: Payer: Self-pay

## 2019-02-22 ENCOUNTER — Encounter: Payer: Self-pay | Admitting: Family Medicine

## 2019-02-22 VITALS — BP 128/72 | HR 75 | Temp 98.3°F | Ht 66.0 in | Wt 215.2 lb

## 2019-02-22 DIAGNOSIS — E1165 Type 2 diabetes mellitus with hyperglycemia: Secondary | ICD-10-CM | POA: Diagnosis not present

## 2019-02-22 DIAGNOSIS — Z794 Long term (current) use of insulin: Secondary | ICD-10-CM | POA: Diagnosis not present

## 2019-02-22 DIAGNOSIS — I10 Essential (primary) hypertension: Secondary | ICD-10-CM

## 2019-02-22 DIAGNOSIS — E1159 Type 2 diabetes mellitus with other circulatory complications: Secondary | ICD-10-CM | POA: Diagnosis not present

## 2019-02-22 DIAGNOSIS — N529 Male erectile dysfunction, unspecified: Secondary | ICD-10-CM

## 2019-02-22 DIAGNOSIS — I152 Hypertension secondary to endocrine disorders: Secondary | ICD-10-CM

## 2019-02-22 LAB — POCT GLYCOSYLATED HEMOGLOBIN (HGB A1C): Hemoglobin A1C: 10 % — AB (ref 4.0–5.6)

## 2019-02-22 MED ORDER — FREESTYLE LIBRE SENSOR SYSTEM MISC: 0 refills | Status: AC

## 2019-02-22 MED ORDER — SEMAGLUTIDE(0.25 OR 0.5MG/DOS) 2 MG/1.5ML ~~LOC~~ SOPN: 0.2500 mg | PEN_INJECTOR | SUBCUTANEOUS | 2 refills | Status: DC

## 2019-02-22 MED ORDER — TADALAFIL 20 MG PO TABS: 20.0000 mg | ORAL_TABLET | Freq: Every day | ORAL | 5 refills | Status: DC | PRN

## 2019-02-22 MED ORDER — INSULIN DEGLUDEC 100 UNIT/ML ~~LOC~~ SOPN: 16.0000 [IU] | PEN_INJECTOR | Freq: Every day | SUBCUTANEOUS | 1 refills | Status: DC

## 2019-02-22 NOTE — Patient Instructions (Signed)
It was very nice to see you today!  Your A1c is up a little bit.  Please start Ozempic 0.25 mg once weekly.  No other changes today to your diabetes medications.   Please start the Cialis.  Let me know if it does not work or if you have any side effects.  Please come back to see me in 91 days or later to recheck your A1c.  Please come back see me sooner if needed.  Take care, Dr Jimmey Ralph

## 2019-02-22 NOTE — Progress Notes (Signed)
   Chief Complaint:  Austin Rush is a 58 y.o. male who presents today with a chief complaint of T2DM follow up.   Assessment/Plan:  T2DM (type 2 diabetes mellitus) (HCC) A1c increased to 10.0.  Will add on Ozempic 0.25 mg daily.  Continue metformin 1000 mg twice daily, Invokana 100 mg daily, and Tresiba 16 units daily.  Discussed lifestyle modifications.  Follow-up in 91 days for repeat A1c.  Hypertension associated with diabetes (HCC) Continue amlodipine 5 mg daily and HCTZ 12.5 mg daily.  Erectile dysfunction No improvement with sildenafil.  Will start tadalafil.  Discussed potential side effects.  Follow-up next office visit.     Subjective:  HPI:  His chronic medical conditions are outlined below:  # T2DM  - On metformin 1000mg  twice daily, invokana 100mg  daily, and tresiba 16 units daily.  Tolerating without side effects. -ROS: No reported polyuria or  polydipsia.  # Essential hypertension - On Norvasc 5mg  daily and HCTZ 12.5mg  daily and tolerating well - ROS: No reported chest pain or shortness of breath  % Erectile dysfunction -Uses sildenafil as needed.  Does not think that it is working well.   ROS: Per HPI  PMH: He reports that he quit smoking about 8 years ago. He has quit using smokeless tobacco.  His smokeless tobacco use included chew. He reports current alcohol use. He reports that he does not use drugs.      Objective:  Physical Exam: BP 128/72 (BP Location: Left Arm, Patient Position: Sitting, Cuff Size: Large)   Pulse 75   Temp 98.3 F (36.8 C) (Oral)   Ht 5\' 6"  (1.676 m)   Wt 215 lb 4 oz (97.6 kg)   SpO2 97%   BMI 34.74 kg/m   Wt Readings from Last 3 Encounters:  02/22/19 215 lb 4 oz (97.6 kg)  11/22/18 219 lb 6.1 oz (99.5 kg)  08/23/18 210 lb 9.6 oz (95.5 kg)  Gen: NAD, resting comfortably CV: Regular rate and rhythm with no murmurs appreciated Pulm: Normal work of breathing, clear to auscultation bilaterally with no crackles, wheezes, or  rhonchi  Results for orders placed or performed in visit on 02/22/19 (from the past 24 hour(s))  POCT HgB A1C     Status: Abnormal   Collection Time: 02/22/19 11:46 AM  Result Value Ref Range   Hemoglobin A1C 10.0 (A) 4.0 - 5.6 %        Camylle Whicker M. Jimmey Ralph, MD 02/22/2019 12:35 PM

## 2019-02-22 NOTE — Assessment & Plan Note (Signed)
No improvement with sildenafil.  Will start tadalafil.  Discussed potential side effects.  Follow-up next office visit.

## 2019-02-22 NOTE — Assessment & Plan Note (Signed)
A1c increased to 10.0.  Will add on Ozempic 0.25 mg daily.  Continue metformin 1000 mg twice daily, Invokana 100 mg daily, and Tresiba 16 units daily.  Discussed lifestyle modifications.  Follow-up in 91 days for repeat A1c.

## 2019-02-22 NOTE — Assessment & Plan Note (Signed)
Continue amlodipine 5 mg daily and HCTZ 12.5 mg daily.

## 2019-05-25 ENCOUNTER — Ambulatory Visit: Payer: BLUE CROSS/BLUE SHIELD | Admitting: Family Medicine

## 2019-06-09 ENCOUNTER — Encounter: Payer: Self-pay | Admitting: Family Medicine

## 2019-06-09 ENCOUNTER — Other Ambulatory Visit: Payer: BC Managed Care – PPO

## 2019-06-09 ENCOUNTER — Ambulatory Visit: Payer: BC Managed Care – PPO | Admitting: Family Medicine

## 2019-06-09 ENCOUNTER — Other Ambulatory Visit: Payer: Self-pay

## 2019-06-09 VITALS — BP 124/68 | HR 79 | Temp 97.7°F | Ht 66.0 in | Wt 199.4 lb

## 2019-06-09 DIAGNOSIS — K5900 Constipation, unspecified: Secondary | ICD-10-CM | POA: Diagnosis not present

## 2019-06-09 DIAGNOSIS — Z794 Long term (current) use of insulin: Secondary | ICD-10-CM

## 2019-06-09 DIAGNOSIS — E1159 Type 2 diabetes mellitus with other circulatory complications: Secondary | ICD-10-CM

## 2019-06-09 DIAGNOSIS — I152 Hypertension secondary to endocrine disorders: Secondary | ICD-10-CM

## 2019-06-09 DIAGNOSIS — I10 Essential (primary) hypertension: Secondary | ICD-10-CM

## 2019-06-09 DIAGNOSIS — E1165 Type 2 diabetes mellitus with hyperglycemia: Secondary | ICD-10-CM | POA: Diagnosis not present

## 2019-06-09 DIAGNOSIS — E669 Obesity, unspecified: Secondary | ICD-10-CM

## 2019-06-09 DIAGNOSIS — Z6832 Body mass index (BMI) 32.0-32.9, adult: Secondary | ICD-10-CM

## 2019-06-09 DIAGNOSIS — E1169 Type 2 diabetes mellitus with other specified complication: Secondary | ICD-10-CM | POA: Diagnosis not present

## 2019-06-09 DIAGNOSIS — E785 Hyperlipidemia, unspecified: Secondary | ICD-10-CM

## 2019-06-09 LAB — BASIC METABOLIC PANEL
BUN: 10 mg/dL (ref 6–23)
CO2: 30 mEq/L (ref 19–32)
Calcium: 10 mg/dL (ref 8.4–10.5)
Chloride: 98 mEq/L (ref 96–112)
Creatinine, Ser: 0.92 mg/dL (ref 0.40–1.50)
GFR: 101.72 mL/min (ref >60.00)
Glucose, Bld: 419 mg/dL — ABNORMAL HIGH (ref 70–99)
Potassium: 4.6 mEq/L (ref 3.5–5.1)
Sodium: 135 mEq/L (ref 135–145)

## 2019-06-09 LAB — CBC
HCT: 46.3 % (ref 39.0–52.0)
Hemoglobin: 15.8 g/dL (ref 13.0–17.0)
MCHC: 34.2 g/dL (ref 30.0–36.0)
MCV: 83.4 fl (ref 78.0–100.0)
Platelets: 221 10*3/uL (ref 150.0–400.0)
RBC: 5.55 Mil/uL (ref 4.22–5.81)
RDW: 13.4 % (ref 11.5–15.5)
WBC: 9.3 10*3/uL (ref 4.0–10.5)

## 2019-06-09 MED ORDER — CANAGLIFLOZIN 300 MG PO TABS: 300.0000 mg | ORAL_TABLET | Freq: Every day | ORAL | 3 refills | Status: DC

## 2019-06-09 MED ORDER — TRULICITY 0.75 MG/0.5ML ~~LOC~~ SOAJ: 0.7500 mg | SUBCUTANEOUS | 2 refills | Status: DC

## 2019-06-09 NOTE — Assessment & Plan Note (Signed)
At goal.  Continue Norvasc 5 mg daily and HCTZ 12.5 mg daily.

## 2019-06-09 NOTE — Progress Notes (Addendum)
   Chief Complaint:  Austin Rush is a 58 y.o. male who presents today with a chief complaint of type II diabetes follow-up.   Assessment/Plan:  Hypertension associated with diabetes ([place]) At goal.  Continue Norvasc 5 mg daily and HCTZ 12.5 mg daily.  Hyperlipidemia associated with type 2 diabetes mellitus (HCC) Continue atorvastatin 40 mg daily.  T2DM (type 2 diabetes mellitus) ([place]) Point-of-care A1c machine today with Errico 106-indicating A1c to hydrate.  Will check venous blood draw A1c.  He is not routinely checking blood sugars at home.  We will start Trulicity 8.14 mg weekly.  Increase Invokana to 300 mg daily.  Continue metformin 1000 mg twice daily and Tresiba 16 units daily.  Advised patient to check fasting blood sugars.  Follow-up in 3 months.  Body mass index is 32.18 kg/m. / Obese BMI Metric Follow Up - 06/09/19 0949      BMI Metric Follow Up-Please document annually   BMI Metric Follow Up  Education provided       Constipation Recommend MiraLAX as needed.    Subjective:  HPI:  He stopped taking ozempic because he thought his blood sugars were increasing on this medications.   He is also having some constipation.  Otherwise he is doing well without any acute complaints today.  His stable, chronic medical conditions are outlined below:  # T2DM  - On metformin 1000mg  twice daily, Invokana 100mg  daily, and tresiba 16 units daily.  Tolerating all without side effects. - No symptomatic lows - Working on diet and exercise -ROS: No reported polyuria or  polydipsia.  # Essential hypertension - On Norvasc 5mg  daily and HCTZ 12.5mg  daily and tolerating well - ROS: No reported chest pain or shortness of breath  ROS: Per HPI  PMH: He reports that he quit smoking about 8 years ago. He has quit using smokeless tobacco.  His smokeless tobacco use included chew. He reports current alcohol use. He reports that he does not use drugs.      Objective:  Physical Exam: BP  124/68   Pulse 79   Temp 97.7 F (36.5 C)   Ht 5\' 6"  (1.676 m)   Wt 199 lb 6.1 oz (90.4 kg)   SpO2 97%   BMI 32.18 kg/m   Wt Readings from Last 3 Encounters:  06/09/19 199 lb 6.1 oz (90.4 kg)  02/22/19 215 lb 4 oz (97.6 kg)  11/22/18 219 lb 6.1 oz (99.5 kg)  Gen: NAD, resting comfortably CV: Regular rate and rhythm with no murmurs appreciated Pulm: Normal work of breathing, clear to auscultation bilaterally with no crackles, wheezes, or rhonchi GI: Normal bowel sounds present. Soft, Nontender, Nondistended. MSK: No edema, cyanosis, or clubbing noted Skin: Warm, dry Neuro: Grossly normal, moves all extremities Psych: Normal affect and thought content  No results found for this or any previous visit (from the past 24 hour(s)).      Algis Greenhouse. Jerline Pain, MD 06/09/2019 9:50 AM

## 2019-06-09 NOTE — Assessment & Plan Note (Signed)
Continue atorvastatin 40 mg daily.  

## 2019-06-09 NOTE — Assessment & Plan Note (Signed)
Point-of-care A1c machine today with Errico 106-indicating A1c to hydrate.  Will check venous blood draw A1c.  He is not routinely checking blood sugars at home.  We will start Trulicity 5.39 mg weekly.  Increase Invokana to 300 mg daily.  Continue metformin 1000 mg twice daily and Tresiba 16 units daily.  Advised patient to check fasting blood sugars.  Follow-up in 3 months.

## 2019-06-09 NOTE — Patient Instructions (Signed)
It was very nice to see you today!  Please start the Trulicity and Invokana.  Please use MiraLAX as needed for constipation.  Please continue working on diet and exercise.  We will check blood work today.  Come back to see me in 3 months, or sooner if needed.  Take care, Dr Jerline Pain  Please try these tips to maintain a healthy lifestyle:   Eat at least 3 REAL meals and 1-2 snacks per day.  Aim for no more than 5 hours between eating.  If you eat breakfast, please do so within one hour of getting up.    Obtain twice as many fruits/vegetables as protein or carbohydrate foods for both lunch and dinner. (Half of each meal should be fruits/vegetables, one quarter protein, and one quarter starchy carbs)   Cut down on sweet beverages. This includes juice, soda, and sweet tea.    Exercise at least 150 minutes every week.

## 2019-06-10 LAB — HEMOGLOBIN A1C: Hgb A1c MFr Bld: >14 % of total Hgb — ABNORMAL HIGH (ref <5.7)

## 2019-06-10 NOTE — Progress Notes (Signed)
Please inform patient of the following:  Blood sugar and A1c are very high. Would like for him to increase tresiba to 18units along with all the other changes we discussed. Would like for him to check blood sugar at least once per day and follow up with me in about 4-6 weeks.  Austin Rush. Jerline Pain, MD 06/10/2019 11:31 AM

## 2019-06-10 NOTE — Progress Notes (Signed)
Notified patient of lab results.Patient voices understanding.  

## 2019-06-23 ENCOUNTER — Other Ambulatory Visit: Payer: Self-pay | Admitting: Family Medicine

## 2019-08-16 ENCOUNTER — Other Ambulatory Visit: Payer: Self-pay | Admitting: Family Medicine

## 2019-08-16 DIAGNOSIS — I10 Essential (primary) hypertension: Secondary | ICD-10-CM

## 2019-09-02 ENCOUNTER — Other Ambulatory Visit: Payer: Self-pay | Admitting: Family Medicine

## 2019-09-09 ENCOUNTER — Ambulatory Visit: Payer: BC Managed Care – PPO | Admitting: Family Medicine

## 2019-09-29 ENCOUNTER — Other Ambulatory Visit: Payer: Self-pay

## 2019-09-29 ENCOUNTER — Ambulatory Visit: Payer: BC Managed Care – PPO | Admitting: Family Medicine

## 2019-09-29 ENCOUNTER — Encounter: Payer: Self-pay | Admitting: Family Medicine

## 2019-09-29 VITALS — BP 138/72 | HR 78 | Temp 97.8°F | Ht 66.0 in | Wt 202.2 lb

## 2019-09-29 DIAGNOSIS — I10 Essential (primary) hypertension: Secondary | ICD-10-CM | POA: Diagnosis not present

## 2019-09-29 DIAGNOSIS — E1159 Type 2 diabetes mellitus with other circulatory complications: Secondary | ICD-10-CM

## 2019-09-29 DIAGNOSIS — Z794 Long term (current) use of insulin: Secondary | ICD-10-CM | POA: Diagnosis not present

## 2019-09-29 DIAGNOSIS — E1165 Type 2 diabetes mellitus with hyperglycemia: Secondary | ICD-10-CM | POA: Diagnosis not present

## 2019-09-29 DIAGNOSIS — I152 Hypertension secondary to endocrine disorders: Secondary | ICD-10-CM

## 2019-09-29 LAB — POCT GLYCOSYLATED HEMOGLOBIN (HGB A1C): Hemoglobin A1C: 13.5 % — AB (ref 4.0–5.6)

## 2019-09-29 MED ORDER — AMLODIPINE BESYLATE 5 MG PO TABS: 5.0000 mg | ORAL_TABLET | Freq: Every day | ORAL | 3 refills | Status: DC

## 2019-09-29 MED ORDER — TRULICITY 0.75 MG/0.5ML ~~LOC~~ SOAJ: 0.7500 mg | SUBCUTANEOUS | 2 refills | Status: DC

## 2019-09-29 MED ORDER — TRESIBA FLEXTOUCH 100 UNIT/ML ~~LOC~~ SOPN: 25.0000 [IU] | PEN_INJECTOR | Freq: Every day | SUBCUTANEOUS | 3 refills | Status: DC

## 2019-09-29 MED ORDER — ATORVASTATIN CALCIUM 40 MG PO TABS: 40.0000 mg | ORAL_TABLET | Freq: Every day | ORAL | 3 refills | Status: DC

## 2019-09-29 NOTE — Assessment & Plan Note (Signed)
A1c improved but however still not controlled at 13.5.  Advised him to restart Trulicity 0.75 mg weekly.  Will increase Tresiba to 25 units daily.  Continue Metformin 1000 mg twice daily and Invokana 300 mg daily.  Follow-up in 3 months to recheck A1c.  If continues to be inadequately controlled would consider referral to endocrinology.

## 2019-09-29 NOTE — Patient Instructions (Signed)
It was very nice to see you today!  We need to do a better job of decreasing your blood sugar.  Please increase your daily insulin to 25 units.  Please restart the Trulicity.  No other changes today.  Come back in 3 months, or sooner if needed.  Take care, Dr Jimmey Ralph  Please try these tips to maintain a healthy lifestyle:   Eat at least 3 REAL meals and 1-2 snacks per day.  Aim for no more than 5 hours between eating.  If you eat breakfast, please do so within one hour of getting up.    Each meal should contain half fruits/vegetables, one quarter protein, and one quarter carbs (no bigger than a computer mouse)   Cut down on sweet beverages. This includes juice, soda, and sweet tea.     Drink at least 1 glass of water with each meal and aim for at least 8 glasses per day   Exercise at least 150 minutes every week.

## 2019-09-29 NOTE — Progress Notes (Signed)
   Tasman Zapata is a 59 y.o. male who presents today for an office visit.  Assessment/Plan:  Chronic Problems Addressed Today: Hypertension associated with diabetes (HCC) At goal.  Continue amlodipine 5 mg daily and HCTZ 12.5 mg daily.  T2DM (type 2 diabetes mellitus) (HCC) A1c improved but however still not controlled at 13.5.  Advised him to restart Trulicity 0.75 mg weekly.  Will increase Tresiba to 25 units daily.  Continue Metformin 1000 mg twice daily and Invokana 300 mg daily.  Follow-up in 3 months to recheck A1c.  If continues to be inadequately controlled would consider referral to endocrinology.    Subjective:  HPI:  Patient here for diabetes follow-up.  Reports compliance with medications.  Had stopped taking Trulicity due to side effects he would like to restart.  Has been taking 21 units of [place].  Has had some dietary indiscretions recently related to holidays.       Objective:  Physical Exam: BP 138/72   Pulse 78   Temp 97.8 F (36.6 C)   Ht 5\' 6"  (1.676 m)   Wt 202 lb 3.2 oz (91.7 kg)   SpO2 98%   BMI 32.64 kg/m   Gen: No acute distress, resting comfortably CV: Regular rate and rhythm with no murmurs appreciated Pulm: Normal work of breathing, clear to auscultation bilaterally with no crackles, wheezes, or rhonchi Neuro: Grossly normal, moves all extremities Psych: Normal affect and thought content      Tanyia Grabbe M. , MD 09/29/2019 3:42 PM

## 2019-09-29 NOTE — Assessment & Plan Note (Signed)
At goal.  Continue amlodipine 5 mg daily and HCTZ 12.5 mg daily.

## 2019-11-21 DIAGNOSIS — K859 Acute pancreatitis without necrosis or infection, unspecified: Secondary | ICD-10-CM

## 2019-11-21 HISTORY — DX: Acute pancreatitis without necrosis or infection, unspecified: K85.90

## 2019-12-03 ENCOUNTER — Emergency Department (HOSPITAL_COMMUNITY)
Admission: EM | Admit: 2019-12-03 | Discharge: 2019-12-04 | Disposition: A | Discharge status: Home / Self Care | Payer: BC Managed Care – PPO | Attending: Emergency Medicine | Admitting: Emergency Medicine

## 2019-12-03 ENCOUNTER — Other Ambulatory Visit: Payer: Self-pay

## 2019-12-03 ENCOUNTER — Emergency Department (HOSPITAL_COMMUNITY): Payer: BC Managed Care – PPO

## 2019-12-03 ENCOUNTER — Encounter (HOSPITAL_COMMUNITY): Payer: Self-pay | Admitting: *Deleted

## 2019-12-03 DIAGNOSIS — Z79899 Other long term (current) drug therapy: Secondary | ICD-10-CM | POA: Diagnosis not present

## 2019-12-03 DIAGNOSIS — E119 Type 2 diabetes mellitus without complications: Secondary | ICD-10-CM | POA: Insufficient documentation

## 2019-12-03 DIAGNOSIS — R109 Unspecified abdominal pain: Secondary | ICD-10-CM | POA: Diagnosis not present

## 2019-12-03 DIAGNOSIS — K859 Acute pancreatitis without necrosis or infection, unspecified: Secondary | ICD-10-CM | POA: Diagnosis not present

## 2019-12-03 DIAGNOSIS — R1084 Generalized abdominal pain: Secondary | ICD-10-CM | POA: Diagnosis not present

## 2019-12-03 DIAGNOSIS — Z7984 Long term (current) use of oral hypoglycemic drugs: Secondary | ICD-10-CM | POA: Insufficient documentation

## 2019-12-03 DIAGNOSIS — R1013 Epigastric pain: Secondary | ICD-10-CM | POA: Diagnosis not present

## 2019-12-03 DIAGNOSIS — I10 Essential (primary) hypertension: Secondary | ICD-10-CM | POA: Diagnosis not present

## 2019-12-03 LAB — URINALYSIS, ROUTINE W REFLEX MICROSCOPIC
Bilirubin Urine: NEGATIVE
Glucose, UA: NEGATIVE mg/dL
Hgb urine dipstick: NEGATIVE
Ketones, ur: NEGATIVE mg/dL
Leukocytes,Ua: NEGATIVE
Nitrite: NEGATIVE
Protein, ur: NEGATIVE mg/dL
Specific Gravity, Urine: 1.017 (ref 1.005–1.030)
pH: 7 (ref 5.0–8.0)

## 2019-12-03 LAB — COMPREHENSIVE METABOLIC PANEL
ALT: 56 U/L — ABNORMAL HIGH (ref 0–44)
AST: 26 U/L (ref 15–41)
Albumin: 3.9 g/dL (ref 3.5–5.0)
Alkaline Phosphatase: 54 U/L (ref 38–126)
Anion gap: 8 (ref 5–15)
BUN: 10 mg/dL (ref 6–20)
CO2: 25 mmol/L (ref 22–32)
Calcium: 9.5 mg/dL (ref 8.9–10.3)
Chloride: 105 mmol/L (ref 98–111)
Creatinine, Ser: 0.74 mg/dL (ref 0.61–1.24)
GFR calc Af Amer: >60 mL/min (ref >60)
GFR calc non Af Amer: >60 mL/min (ref >60)
Glucose, Bld: 138 mg/dL — ABNORMAL HIGH (ref 70–99)
Potassium: 4.4 mmol/L (ref 3.5–5.1)
Sodium: 138 mmol/L (ref 135–145)
Total Bilirubin: 0.9 mg/dL (ref 0.3–1.2)
Total Protein: 6.9 g/dL (ref 6.5–8.1)

## 2019-12-03 LAB — CBC
HCT: 41.7 % (ref 39.0–52.0)
Hemoglobin: 14.7 g/dL (ref 13.0–17.0)
MCH: 29.3 pg (ref 26.0–34.0)
MCHC: 35.3 g/dL (ref 30.0–36.0)
MCV: 83.2 fL (ref 80.0–100.0)
Platelets: 280 10*3/uL (ref 150–400)
RBC: 5.01 MIL/uL (ref 4.22–5.81)
RDW: 13 % (ref 11.5–15.5)
WBC: 12.6 10*3/uL — ABNORMAL HIGH (ref 4.0–10.5)
nRBC: 0 % (ref 0.0–0.2)

## 2019-12-03 LAB — LIPASE, BLOOD: Lipase: 217 U/L — ABNORMAL HIGH (ref 11–51)

## 2019-12-03 LAB — TRIGLYCERIDES: Triglycerides: 104 mg/dL (ref <150)

## 2019-12-03 MED ORDER — IOHEXOL 300 MG/ML  SOLN
100.0000 mL | Freq: Once | INTRAMUSCULAR | Status: AC | PRN
  Administered 2019-12-03: 100 mL via INTRAVENOUS

## 2019-12-03 MED ORDER — SODIUM CHLORIDE 0.9 % IV BOLUS
1000.0000 mL | Freq: Once | INTRAVENOUS | Status: AC
  Administered 2019-12-03: 1000 mL via INTRAVENOUS

## 2019-12-03 MED ORDER — ONDANSETRON HCL 4 MG/2ML IJ SOLN
4.0000 mg | Freq: Once | INTRAMUSCULAR | Status: AC
  Administered 2019-12-03: 4 mg via INTRAVENOUS
  Filled 2019-12-03: qty 2

## 2019-12-03 MED ORDER — MORPHINE SULFATE (PF) 4 MG/ML IV SOLN
4.0000 mg | Freq: Once | INTRAVENOUS | Status: AC
  Administered 2019-12-03: 4 mg via INTRAVENOUS
  Filled 2019-12-03: qty 1

## 2019-12-03 MED ORDER — SODIUM CHLORIDE 0.9% FLUSH
3.0000 mL | Freq: Once | INTRAVENOUS | Status: AC
  Administered 2019-12-03: 3 mL via INTRAVENOUS

## 2019-12-03 NOTE — ED Notes (Signed)
Pt transported to CT ?

## 2019-12-03 NOTE — ED Provider Notes (Signed)
[name] EMERGENCY DEPARTMENT Provider Note   CSN: [id number] Arrival date & time: 12/03/19  1935     History Chief Complaint  Patient presents with  . Abdominal Pain    Austin Rush is a 59 y.o. male.  Patient with past medical history of diabetes, prior pancreatitis, hyperlipidemia, presents to the emergency department with a chief complaint of upper abdominal pain.  He states that he has been having the pain for the past 4 days.  He states that this feels similar to his prior pancreatitis.  He denies any nausea, vomiting, or diarrhea.  Denies any recent alcohol use.  Denies any successful treatments prior to arrival.  The history is provided by the patient. No language interpreter was used.       Past Medical History:  Diagnosis Date  . Hyperlipidemia   . Hypertension     Patient Active Problem List   Diagnosis Date Noted  . T2DM (type 2 diabetes mellitus) ([place]) 05/13/2018  . Hyperlipidemia associated with type 2 diabetes mellitus ([place]) 05/13/2018  . Former smoker - Candidate for yearly CT 04/27/2018  . Dyshydrosis 04/27/2018  . Erectile dysfunction 05/19/2016  . Vitamin D deficiency 12/29/2014  . Hypertension associated with diabetes ([place]) 12/29/2014    Past Surgical History:  Procedure Laterality Date  . APPENDECTOMY    . COLONOSCOPY N/A 02/22/2016   Procedure: COLONOSCOPY;  Surgeon: Danie Binder, MD;  Location: [facility];  Service: Endoscopy;  Laterality: N/A;  1100       Family History  Problem Relation Age of Onset  . Diabetes Mother   . Cancer Father        PROSTATE  . Colon cancer Neg Hx     Social History   Tobacco Use  . Smoking status: Former Smoker    Quit date: 02/21/2011    Years since quitting: 8.7  . Smokeless tobacco: Former Systems developer    Types: Chew  Substance Use Topics  . Alcohol use: Yes    Alcohol/week: 0.0 standard drinks    Comment: Wine about once a month  . Drug use: No    Home Medications Prior to  Admission medications   Medication Sig Start Date End Date Taking? Authorizing Provider  amLODipine (NORVASC) 5 MG tablet Take 1 tablet (5 mg total) by mouth daily. 09/29/19  Yes Vivi Barrack, MD  aspirin EC 81 MG tablet Take 81 mg by mouth daily.   Yes [provider]  atorvastatin (LIPITOR) 40 MG tablet Take 1 tablet (40 mg total) by mouth daily. 09/29/19  Yes Vivi Barrack, MD  Dulaglutide (TRULICITY) 8.36 OQ/9.4TM SOPN Inject 0.75 mg into the skin once a week. Patient taking differently: Inject 0.75 mg into the skin every Monday.  09/29/19  Yes Vivi Barrack, MD  hydrochlorothiazide (HYDRODIURIL) 12.5 MG tablet Take 1 tablet by mouth once daily Patient taking differently: Take 12.5 mg by mouth daily.  08/16/19  Yes Vivi Barrack, MD  insulin degludec (TRESIBA FLEXTOUCH) 100 UNIT/ML SOPN FlexTouch Pen Inject 0.25 mLs (25 Units total) into the skin daily. Patient taking differently: Inject 21 Units into the skin daily.  09/29/19  Yes Vivi Barrack, MD  metFORMIN (GLUCOPHAGE-XR) 500 MG 24 hr tablet Take 2 tablets (1,000 mg total) by mouth 2 (two) times daily. Patient taking differently: Take 1,000 mg by mouth daily with breakfast.  08/25/18  Yes Vivi Barrack, MD  Multiple Vitamin (MULTIVITAMIN WITH MINERALS) TABS tablet Take 1 tablet by  mouth daily. Centrum Silver   Yes [provider]  Omega-3 Fatty Acids (FISH OIL) 1000 MG CPDR Take 1,000 mg by mouth daily.    Yes [provider]  Blood Glucose Monitoring Suppl (ACCU-CHEK GUIDE) w/Device KIT 1 kit by Does not apply route daily. Use daily as needed to check blood sugar. 05/13/18   Vivi Barrack, MD  canagliflozin [facility]) 300 MG TABS tablet Take 1 tablet (300 mg total) by mouth daily before breakfast. Patient not taking: Reported on 12/03/2019 06/09/19   Vivi Barrack, MD  Continuous Blood Gluc Sensor (FREESTYLE LIBRE SENSOR SYSTEM) MISC Use 4 times daily as needed to check blood sugar. . 02/22/19   Vivi Barrack, MD  glucose blood (ACCU-CHEK GUIDE) test strip Use twice daily as needed to check blood sugar. 06/10/18   Vivi Barrack, MD  Insulin Pen Needle (PEN NEEDLES) 32G X 4 MM MISC 1 application by Does not apply route daily. Use daily to inject insulin. 05/13/18   Vivi Barrack, MD  Lancets (ACCU-CHEK SAFE-T PRO) lancets Use twice daily as needed to check blood sugar 06/10/18   Vivi Barrack, MD  tadalafil (CIALIS) 20 MG tablet Take 1 tablet (20 mg total) by mouth daily as needed for erectile dysfunction. Patient not taking: Reported on 12/03/2019 02/22/19   Vivi Barrack, MD    Allergies    Patient has no known allergies.  Review of Systems   Review of Systems  All other systems reviewed and are negative.   Physical Exam Updated Vital Signs BP (!) 172/92   Pulse 83   Temp 98.3 F (36.8 C) (Oral)   Resp 18   Ht '5\' 6"'$  (1.676 m)   Wt 90.3 kg   SpO2 99%   BMI 32.12 kg/m   Physical Exam Vitals and nursing note reviewed.  Constitutional:      Appearance: He is well-developed.  HENT:     Head: Normocephalic and atraumatic.  Eyes:     Conjunctiva/sclera: Conjunctivae normal.  Cardiovascular:     Rate and Rhythm: Normal rate and regular rhythm.     Heart sounds: No murmur.  Pulmonary:     Effort: Pulmonary effort is normal. No respiratory distress.     Breath sounds: Normal breath sounds.  Abdominal:     Palpations: Abdomen is soft.     Tenderness: There is abdominal tenderness.     Comments: Generalized abdominal discomfort with some tenderness in the epigastric region, no Murphy sign  Musculoskeletal:     Cervical back: Neck supple.  Skin:    General: Skin is warm and dry.  Neurological:     Mental Status: He is alert.     ED Results / Procedures / Treatments   Labs (all labs ordered are listed, but only abnormal results are displayed) Labs Reviewed  LIPASE, BLOOD - Abnormal; Notable for the following components:      Result Value   Lipase 217 (*)    All other  components within normal limits  COMPREHENSIVE METABOLIC PANEL - Abnormal; Notable for the following components:   Glucose, Bld 138 (*)    ALT 56 (*)    All other components within normal limits  CBC - Abnormal; Notable for the following components:   WBC 12.6 (*)    All other components within normal limits  URINALYSIS, ROUTINE W REFLEX MICROSCOPIC  TRIGLYCERIDES    EKG None  Radiology CT ABDOMEN PELVIS W CONTRAST  Result Date: 12/03/2019 CLINICAL  DATA:  Lower abdominal pain x4 days. EXAM: CT ABDOMEN AND PELVIS WITH CONTRAST TECHNIQUE: Multidetector CT imaging of the abdomen and pelvis was performed using the standard protocol following bolus administration of intravenous contrast. CONTRAST:  14m OMNIPAQUE IOHEXOL 300 MG/ML  SOLN COMPARISON:  October 08, 2018. FINDINGS: Lower chest: No acute abnormality. Hepatobiliary: A stable 1.5 cm cystic appearing areas seen within the anterior aspect of the liver dome. No gallstones, gallbladder wall thickening, or biliary dilatation. Pancreas: Very mild peripancreatic inflammatory fat stranding is seen surrounding and enlarged pancreatic head. Spleen: Normal in size without focal abnormality. Adrenals/Urinary Tract: Adrenal glands are unremarkable. Kidneys are normal, without renal calculi or hydronephrosis. A stable 1.5 cm simple cyst is seen along the anterior aspect of the mid right kidney. An additional stable 0.9 cm cyst is seen within the lower pole of the right kidney. Bladder is unremarkable. Stomach/Bowel: Stomach is within normal limits. The appendix is not clearly identified. No evidence of bowel wall thickening, distention, or inflammatory changes. Vascular/Lymphatic: Moderate severity aortic atherosclerosis. No enlarged abdominal or pelvic lymph nodes. Reproductive: Prostate is unremarkable. Other: There is a stable 2.6 cm x 2.0 cm fat containing left inguinal hernia. Musculoskeletal: No acute or significant osseous findings. IMPRESSION: 1.  Findings consistent with mild acute pancreatitis. Correlation with pancreatic enzymes is recommended. 2. Small, stable hepatic and renal cysts. Electronically Signed   By: TVirgina NorfolkM.D.   On: 12/03/2019 23:05    Procedures Procedures (including critical care time)  Medications Ordered in ED Medications  sodium chloride flush (NS) 0.9 % injection 3 mL (3 mLs Intravenous Given 12/03/19 2222)  morphine 4 MG/ML injection 4 mg (4 mg Intravenous Given 12/03/19 2226)  ondansetron (ZOFRAN) injection 4 mg (4 mg Intravenous Given 12/03/19 2226)  sodium chloride 0.9 % bolus 1,000 mL (1,000 mLs Intravenous New Bag/Given 12/03/19 2232)    ED Course  I have reviewed the triage vital signs and the nursing notes.  Pertinent labs & imaging results that were available during my care of the patient were reviewed by me and considered in my medical decision making (see chart for details).    MDM Rules/Calculators/A&P                       Patient with abdominal pain for the past 4 days.  He is afebrile.  He appears somewhat uncomfortable.  Lipase is noted to be 217.  He does have a history of pancreatitis.  He denies using any alcohol.  He does have hyperlipidemia.  Will check triglycerides.  Will give fluids and pain medicine.  Will check CT.  CT findings are consistent with mild acute pancreatitis.   Patient feels significantly improved after 2 L of fluid.  I did recommend that the patient be admitted for pancreatitis, but he states that he would prefer to be discharged.  Given that his pain is well controlled, he is not vomiting, he is afebrile, and CT shows only mild findings of pancreatitis, I feel that this may be appropriate.  I have instructed the patient to return for any new or worsening symptoms.  He understands and agrees with this plan.  Final Clinical Impression(s) / ED Diagnoses Final diagnoses:  Acute pancreatitis without infection or necrosis, unspecified pancreatitis type    Rx /  DC Orders ED Discharge Orders         Ordered    HYDROcodone-acetaminophen (NORCO/VICODIN) 5-325 MG tablet  Every 6 hours PRN  12/04/19 0123    ondansetron (ZOFRAN ODT) 4 MG disintegrating tablet  Every 8 hours PRN     12/04/19 0123           Montine Circle, PA-C 12/04/19 0126    Maudie Flakes, MD 12/07/19 [phone]

## 2019-12-03 NOTE — ED Triage Notes (Signed)
The  Pt is c/o rt lower abd pain for 4 days  Some pain into his chest earlier today  None now   No n v or diarrhea

## 2019-12-04 MED ORDER — ONDANSETRON 4 MG PO TBDP: 4.0000 mg | ORAL_TABLET | Freq: Three times a day (TID) | ORAL | 0 refills | Status: DC | PRN

## 2019-12-04 MED ORDER — HYDROCODONE-ACETAMINOPHEN 5-325 MG PO TABS: 1.0000 | ORAL_TABLET | Freq: Four times a day (QID) | ORAL | 0 refills | Status: DC | PRN

## 2019-12-04 NOTE — ED Notes (Signed)
Discharge instructions reviewed with pt. Pt verbalized understanding.   

## 2019-12-05 ENCOUNTER — Other Ambulatory Visit: Payer: Self-pay | Admitting: Family Medicine

## 2019-12-05 ENCOUNTER — Telehealth: Payer: Self-pay | Admitting: Family Medicine

## 2019-12-05 DIAGNOSIS — I10 Essential (primary) hypertension: Secondary | ICD-10-CM

## 2019-12-05 NOTE — Telephone Encounter (Signed)
Notified patient that he needs to return to the ED due to his symptoms. Patient voices understanding.

## 2019-12-05 NOTE — Telephone Encounter (Signed)
Patient called in that he went to the ED on Saturday and was diagnosed that he had Pancreatitis. Patient stated that he still is in a lot of pain and he cant eat. Please advise.

## 2019-12-06 DIAGNOSIS — F1020 Alcohol dependence, uncomplicated: Secondary | ICD-10-CM | POA: Insufficient documentation

## 2019-12-06 DIAGNOSIS — E1159 Type 2 diabetes mellitus with other circulatory complications: Secondary | ICD-10-CM | POA: Diagnosis not present

## 2019-12-06 DIAGNOSIS — Z79899 Other long term (current) drug therapy: Secondary | ICD-10-CM | POA: Diagnosis not present

## 2019-12-06 DIAGNOSIS — K859 Acute pancreatitis without necrosis or infection, unspecified: Principal | ICD-10-CM | POA: Insufficient documentation

## 2019-12-06 DIAGNOSIS — E785 Hyperlipidemia, unspecified: Secondary | ICD-10-CM | POA: Diagnosis not present

## 2019-12-06 DIAGNOSIS — I10 Essential (primary) hypertension: Secondary | ICD-10-CM | POA: Diagnosis not present

## 2019-12-06 DIAGNOSIS — Z833 Family history of diabetes mellitus: Secondary | ICD-10-CM | POA: Insufficient documentation

## 2019-12-06 DIAGNOSIS — Z87891 Personal history of nicotine dependence: Secondary | ICD-10-CM | POA: Insufficient documentation

## 2019-12-06 DIAGNOSIS — Z8719 Personal history of other diseases of the digestive system: Secondary | ICD-10-CM | POA: Insufficient documentation

## 2019-12-06 DIAGNOSIS — Z6830 Body mass index (BMI) 30.0-30.9, adult: Secondary | ICD-10-CM | POA: Insufficient documentation

## 2019-12-06 DIAGNOSIS — Z794 Long term (current) use of insulin: Secondary | ICD-10-CM | POA: Insufficient documentation

## 2019-12-06 DIAGNOSIS — Z7982 Long term (current) use of aspirin: Secondary | ICD-10-CM | POA: Diagnosis not present

## 2019-12-06 DIAGNOSIS — Z20822 Contact with and (suspected) exposure to covid-19: Secondary | ICD-10-CM | POA: Insufficient documentation

## 2019-12-06 DIAGNOSIS — E669 Obesity, unspecified: Secondary | ICD-10-CM | POA: Insufficient documentation

## 2019-12-06 DIAGNOSIS — I700 Atherosclerosis of aorta: Secondary | ICD-10-CM | POA: Diagnosis not present

## 2019-12-07 ENCOUNTER — Observation Stay (HOSPITAL_COMMUNITY)
Admission: EM | Admit: 2019-12-07 | Discharge: 2019-12-09 | Disposition: A | Discharge status: Home / Self Care | Payer: BC Managed Care – PPO | Attending: Family Medicine | Admitting: Family Medicine

## 2019-12-07 ENCOUNTER — Observation Stay (HOSPITAL_COMMUNITY): Payer: BC Managed Care – PPO

## 2019-12-07 ENCOUNTER — Other Ambulatory Visit: Payer: Self-pay

## 2019-12-07 ENCOUNTER — Encounter (HOSPITAL_COMMUNITY): Payer: Self-pay | Admitting: Emergency Medicine

## 2019-12-07 ENCOUNTER — Emergency Department (HOSPITAL_COMMUNITY): Payer: BC Managed Care – PPO

## 2019-12-07 DIAGNOSIS — E785 Hyperlipidemia, unspecified: Secondary | ICD-10-CM

## 2019-12-07 DIAGNOSIS — E669 Obesity, unspecified: Secondary | ICD-10-CM

## 2019-12-07 DIAGNOSIS — K859 Acute pancreatitis without necrosis or infection, unspecified: Secondary | ICD-10-CM | POA: Diagnosis not present

## 2019-12-07 DIAGNOSIS — K7689 Other specified diseases of liver: Secondary | ICD-10-CM | POA: Diagnosis not present

## 2019-12-07 DIAGNOSIS — K852 Alcohol induced acute pancreatitis without necrosis or infection: Secondary | ICD-10-CM | POA: Diagnosis not present

## 2019-12-07 DIAGNOSIS — F1020 Alcohol dependence, uncomplicated: Secondary | ICD-10-CM

## 2019-12-07 DIAGNOSIS — E119 Type 2 diabetes mellitus without complications: Secondary | ICD-10-CM

## 2019-12-07 DIAGNOSIS — E1169 Type 2 diabetes mellitus with other specified complication: Secondary | ICD-10-CM | POA: Diagnosis not present

## 2019-12-07 DIAGNOSIS — I152 Hypertension secondary to endocrine disorders: Secondary | ICD-10-CM | POA: Diagnosis present

## 2019-12-07 DIAGNOSIS — I700 Atherosclerosis of aorta: Secondary | ICD-10-CM

## 2019-12-07 DIAGNOSIS — E1159 Type 2 diabetes mellitus with other circulatory complications: Secondary | ICD-10-CM | POA: Diagnosis not present

## 2019-12-07 DIAGNOSIS — Z03818 Encounter for observation for suspected exposure to other biological agents ruled out: Secondary | ICD-10-CM | POA: Diagnosis not present

## 2019-12-07 DIAGNOSIS — I10 Essential (primary) hypertension: Secondary | ICD-10-CM

## 2019-12-07 HISTORY — DX: Type 2 diabetes mellitus without complications: E11.9

## 2019-12-07 HISTORY — DX: Alcohol dependence, uncomplicated: F10.20

## 2019-12-07 LAB — URINALYSIS, ROUTINE W REFLEX MICROSCOPIC
Bilirubin Urine: NEGATIVE
Glucose, UA: 50 mg/dL — AB
Hgb urine dipstick: NEGATIVE
Ketones, ur: NEGATIVE mg/dL
Leukocytes,Ua: NEGATIVE
Nitrite: NEGATIVE
Protein, ur: NEGATIVE mg/dL
Specific Gravity, Urine: 1.012 (ref 1.005–1.030)
pH: 7 (ref 5.0–8.0)

## 2019-12-07 LAB — RESPIRATORY PANEL BY RT PCR (FLU A&B, COVID)
Influenza A by PCR: NEGATIVE
Influenza B by PCR: NEGATIVE
SARS Coronavirus 2 by RT PCR: NEGATIVE

## 2019-12-07 LAB — COMPREHENSIVE METABOLIC PANEL
ALT: 59 U/L — ABNORMAL HIGH (ref 0–44)
AST: 34 U/L (ref 15–41)
Albumin: 3.6 g/dL (ref 3.5–5.0)
Alkaline Phosphatase: 58 U/L (ref 38–126)
Anion gap: 10 (ref 5–15)
BUN: 9 mg/dL (ref 6–20)
CO2: 26 mmol/L (ref 22–32)
Calcium: 9.5 mg/dL (ref 8.9–10.3)
Chloride: 102 mmol/L (ref 98–111)
Creatinine, Ser: 0.84 mg/dL (ref 0.61–1.24)
GFR calc Af Amer: >60 mL/min (ref >60)
GFR calc non Af Amer: >60 mL/min (ref >60)
Glucose, Bld: 151 mg/dL — ABNORMAL HIGH (ref 70–99)
Potassium: 4.2 mmol/L (ref 3.5–5.1)
Sodium: 138 mmol/L (ref 135–145)
Total Bilirubin: 0.4 mg/dL (ref 0.3–1.2)
Total Protein: 7.3 g/dL (ref 6.5–8.1)

## 2019-12-07 LAB — CBC
HCT: 43.2 % (ref 39.0–52.0)
Hemoglobin: 14.8 g/dL (ref 13.0–17.0)
MCH: 28.7 pg (ref 26.0–34.0)
MCHC: 34.3 g/dL (ref 30.0–36.0)
MCV: 83.7 fL (ref 80.0–100.0)
Platelets: 301 10*3/uL (ref 150–400)
RBC: 5.16 MIL/uL (ref 4.22–5.81)
RDW: 12.8 % (ref 11.5–15.5)
WBC: 10.7 10*3/uL — ABNORMAL HIGH (ref 4.0–10.5)
nRBC: 0 % (ref 0.0–0.2)

## 2019-12-07 LAB — RAPID URINE DRUG SCREEN, HOSP PERFORMED
Amphetamines: NOT DETECTED
Barbiturates: NOT DETECTED
Benzodiazepines: NOT DETECTED
Cocaine: NOT DETECTED
Opiates: NOT DETECTED
Tetrahydrocannabinol: POSITIVE — AB

## 2019-12-07 LAB — HIV ANTIBODY (ROUTINE TESTING W REFLEX): HIV Screen 4th Generation wRfx: NONREACTIVE

## 2019-12-07 LAB — LIPASE, BLOOD: Lipase: 151 U/L — ABNORMAL HIGH (ref 11–51)

## 2019-12-07 LAB — LACTATE DEHYDROGENASE: LDH: 150 U/L (ref 98–192)

## 2019-12-07 LAB — GLUCOSE, CAPILLARY: Glucose-Capillary: 126 mg/dL — ABNORMAL HIGH (ref 70–99)

## 2019-12-07 LAB — HEMOGLOBIN A1C
Hgb A1c MFr Bld: 8.2 % — ABNORMAL HIGH (ref 4.8–5.6)
Mean Plasma Glucose: 188.64 mg/dL

## 2019-12-07 LAB — CBG MONITORING, ED: Glucose-Capillary: 120 mg/dL — ABNORMAL HIGH (ref 70–99)

## 2019-12-07 MED ORDER — LORAZEPAM 1 MG PO TABS: 1.0000 mg | ORAL_TABLET | ORAL | Status: DC | PRN

## 2019-12-07 MED ORDER — FOLIC ACID 1 MG PO TABS
1.0000 mg | ORAL_TABLET | Freq: Every day | ORAL | Status: DC
  Administered 2019-12-07 – 2019-12-09 (×3): 1 mg via ORAL
  Filled 2019-12-07 (×3): qty 1

## 2019-12-07 MED ORDER — AMLODIPINE BESYLATE 5 MG PO TABS
5.0000 mg | ORAL_TABLET | Freq: Every day | ORAL | Status: DC
  Administered 2019-12-07 – 2019-12-09 (×3): 5 mg via ORAL
  Filled 2019-12-07 (×3): qty 1

## 2019-12-07 MED ORDER — ONDANSETRON HCL 4 MG/2ML IJ SOLN: 4.0000 mg | Freq: Four times a day (QID) | INTRAMUSCULAR | Status: DC | PRN

## 2019-12-07 MED ORDER — IOHEXOL 300 MG/ML  SOLN
100.0000 mL | Freq: Once | INTRAMUSCULAR | Status: AC | PRN
  Administered 2019-12-07: 100 mL via INTRAVENOUS

## 2019-12-07 MED ORDER — ACETAMINOPHEN 325 MG PO TABS
650.0000 mg | ORAL_TABLET | Freq: Four times a day (QID) | ORAL | Status: DC | PRN
  Administered 2019-12-07: 650 mg via ORAL
  Filled 2019-12-07: qty 2

## 2019-12-07 MED ORDER — ADULT MULTIVITAMIN W/MINERALS CH
1.0000 | ORAL_TABLET | Freq: Every day | ORAL | Status: DC
  Administered 2019-12-07 – 2019-12-09 (×3): 1 via ORAL
  Filled 2019-12-07 (×3): qty 1

## 2019-12-07 MED ORDER — MORPHINE SULFATE (PF) 4 MG/ML IV SOLN
4.0000 mg | Freq: Once | INTRAVENOUS | Status: AC
  Administered 2019-12-07: 4 mg via INTRAVENOUS
  Filled 2019-12-07: qty 1

## 2019-12-07 MED ORDER — LORAZEPAM 2 MG/ML IJ SOLN: 1.0000 mg | INTRAMUSCULAR | Status: DC | PRN

## 2019-12-07 MED ORDER — SODIUM CHLORIDE 0.9% FLUSH: 3.0000 mL | Freq: Once | INTRAVENOUS | Status: DC

## 2019-12-07 MED ORDER — ONDANSETRON HCL 4 MG PO TABS: 4.0000 mg | ORAL_TABLET | Freq: Four times a day (QID) | ORAL | Status: DC | PRN

## 2019-12-07 MED ORDER — ACETAMINOPHEN 650 MG RE SUPP: 650.0000 mg | Freq: Four times a day (QID) | RECTAL | Status: DC | PRN

## 2019-12-07 MED ORDER — INSULIN ASPART 100 UNIT/ML ~~LOC~~ SOLN
0.0000 [IU] | Freq: Three times a day (TID) | SUBCUTANEOUS | Status: DC
  Administered 2019-12-08: 3 [IU] via SUBCUTANEOUS
  Administered 2019-12-09: 2 [IU] via SUBCUTANEOUS

## 2019-12-07 MED ORDER — THIAMINE HCL 100 MG/ML IJ SOLN: 100.0000 mg | Freq: Every day | INTRAMUSCULAR | Status: DC

## 2019-12-07 MED ORDER — THIAMINE HCL 100 MG PO TABS
100.0000 mg | ORAL_TABLET | Freq: Every day | ORAL | Status: DC
  Administered 2019-12-07 – 2019-12-09 (×3): 100 mg via ORAL
  Filled 2019-12-07 (×3): qty 1

## 2019-12-07 MED ORDER — LACTATED RINGERS IV SOLN: INTRAVENOUS | Status: DC

## 2019-12-07 MED ORDER — SODIUM CHLORIDE 0.9 % IV BOLUS
1000.0000 mL | Freq: Once | INTRAVENOUS | Status: AC
  Administered 2019-12-07: 1000 mL via INTRAVENOUS

## 2019-12-07 MED ORDER — ASPIRIN EC 81 MG PO TBEC
81.0000 mg | DELAYED_RELEASE_TABLET | Freq: Every day | ORAL | Status: DC
  Administered 2019-12-07 – 2019-12-09 (×3): 81 mg via ORAL
  Filled 2019-12-07 (×3): qty 1

## 2019-12-07 MED ORDER — MORPHINE SULFATE (PF) 2 MG/ML IV SOLN: 2.0000 mg | INTRAVENOUS | Status: DC | PRN

## 2019-12-07 MED ORDER — ENOXAPARIN SODIUM 40 MG/0.4ML ~~LOC~~ SOLN
40.0000 mg | SUBCUTANEOUS | Status: DC
  Administered 2019-12-07 – 2019-12-08 (×2): 40 mg via SUBCUTANEOUS
  Filled 2019-12-07 (×2): qty 0.4

## 2019-12-07 MED ORDER — INSULIN ASPART 100 UNIT/ML ~~LOC~~ SOLN: 0.0000 [IU] | Freq: Every day | SUBCUTANEOUS | Status: DC

## 2019-12-07 NOTE — ED Triage Notes (Signed)
Pt reports abdominal pain.  States he was here this weekend, diagnosed w/ pancreatitis but decided to leave.  States pain is worse now.

## 2019-12-07 NOTE — ED Notes (Signed)
Pt asking to eat, denies any increase in pain after taking medication.

## 2019-12-07 NOTE — ED Notes (Signed)
5N unable to take report until after shift change.

## 2019-12-07 NOTE — ED Notes (Signed)
Pt states that he has not had any alcohol in "3 to 4 weeks".

## 2019-12-07 NOTE — H&P (Addendum)
History and Physical    Austin Rush VOH:[id number] DOB: [birth year] DOA: 12/07/2019  PCP: Vivi Barrack, MD Consultants:  None Patient coming from:  Home - lives with sister; NOK: Sister  Chief Complaint: Abdominal pain  HPI: Austin Rush is a 59 y.o. male with medical history significant of HTN and HLD presenting with abdominal pain.  He reports that several years ago he had one bout of abdominal pain.  He started again on Friday with severe right-sided abdominal pain with radiation to the mid-epigastric region.  He denies n/v.  No fever.  He was treated in the ER with mild improvement but symptoms have worsened.  He continues to drink a pint of scotch daily.  He reports no known h/o gallbladder disease.  He reports that his sugars are well controlled since starting Trulicity - although on his MAR he is listed as no longer taking this because of patient preference.      ED Course:  Here with pancreatitis 5 days ago, felt better, declined admission.  Persistent pain, now wants to stay.  Better with morphine.  Continuing to drink ETOH.  CT with peripancreatic inflammation.  Review of Systems: As per HPI; otherwise review of systems reviewed and negative.   Ambulatory Status:   Ambulates without assistance  Past Medical History:  Diagnosis Date  . Alcohol dependence ([place])   . Hyperlipidemia   . Hypertension     Past Surgical History:  Procedure Laterality Date  . APPENDECTOMY    . COLONOSCOPY N/A 02/22/2016   Procedure: COLONOSCOPY;  Surgeon: Danie Binder, MD;  [facility];  Service: Endoscopy;  Laterality: N/A;  1100    Social History   Socioeconomic History  . Marital status: Single    Spouse name: Not on file  . Number of children: Not on file  . Years of education: Not on file  . Highest education level: Not on file  Occupational History  . Occupation: Oncologist  Tobacco Use  . Smoking status: Former Smoker    Quit date: 02/21/2011    Years since  quitting: 8.7  . Smokeless tobacco: Former Systems developer    Types: Chew  Substance and Sexual Activity  . Alcohol use: Yes    Alcohol/week: 0.0 standard drinks    Comment: a pint of scotch most days  . Drug use: Yes    Types: Marijuana    Comment: "about a month ago"  . Sexual activity: Not on file  Other Topics Concern  . Not on file  Social History Narrative  . Not on file   Social Determinants of Health   Financial Resource Strain:   . Difficulty of Paying Living Expenses:   Food Insecurity:   . Worried About Charity fundraiser in the Last Year:   . Arboriculturist in the Last Year:   Transportation Needs:   . Film/video editor (Medical):   Marland Kitchen Lack of Transportation (Non-Medical):   Physical Activity:   . Days of Exercise per Week:   . Minutes of Exercise per Session:   Stress:   . Feeling of Stress :   Social Connections:   . Frequency of Communication with Friends and Family:   . Frequency of Social Gatherings with Friends and Family:   . Attends Religious Services:   . Active Member of Clubs or Organizations:   . Attends Archivist Meetings:   Marland Kitchen Marital Status:   Intimate Partner Violence:   . Fear of  Current or Ex-Partner:   . Emotionally Abused:   Marland Kitchen Physically Abused:   . Sexually Abused:     No Known Allergies  Family History  Problem Relation Age of Onset  . Diabetes Mother   . Cancer Father        PROSTATE  . Colon cancer Neg Hx     Prior to Admission medications   Medication Sig Start Date End Date Taking? Authorizing Provider  amLODipine (NORVASC) 5 MG tablet Take 1 tablet (5 mg total) by mouth daily. 09/29/19   Vivi Barrack, MD  aspirin EC 81 MG tablet Take 81 mg by mouth daily.    [provider]  atorvastatin (LIPITOR) 40 MG tablet Take 1 tablet (40 mg total) by mouth daily. 09/29/19   Vivi Barrack, MD  Blood Glucose Monitoring Suppl (ACCU-CHEK GUIDE) w/Device KIT 1 kit by Does not apply route daily. Use daily as needed to  check blood sugar. 05/13/18   Vivi Barrack, MD  canagliflozin [facility]) 300 MG TABS tablet Take 1 tablet (300 mg total) by mouth daily before breakfast. Patient not taking: Reported on 12/03/2019 06/09/19   Vivi Barrack, MD  Continuous Blood Gluc Sensor (FREESTYLE LIBRE SENSOR SYSTEM) MISC Use 4 times daily as needed to check blood sugar. . 02/22/19   Vivi Barrack, MD  Dulaglutide (TRULICITY) 1.69 IH/0.3UU SOPN Inject 0.75 mg into the skin once a week. Patient taking differently: Inject 0.75 mg into the skin every Monday.  09/29/19   Vivi Barrack, MD  glucose blood (ACCU-CHEK GUIDE) test strip Use twice daily as needed to check blood sugar. 06/10/18   Vivi Barrack, MD  hydrochlorothiazide (HYDRODIURIL) 12.5 MG tablet Take 1 tablet by mouth once daily 12/05/19   Vivi Barrack, MD  HYDROcodone-acetaminophen (NORCO/VICODIN) 5-325 MG tablet Take 1-2 tablets by mouth every 6 (six) hours as needed. 12/04/19   Montine Circle, PA-C  insulin degludec (TRESIBA FLEXTOUCH) 100 UNIT/ML SOPN FlexTouch Pen Inject 0.25 mLs (25 Units total) into the skin daily. Patient taking differently: Inject 21 Units into the skin daily.  09/29/19   Vivi Barrack, MD  Insulin Pen Needle (PEN NEEDLES) 32G X 4 MM MISC 1 application by Does not apply route daily. Use daily to inject insulin. 05/13/18   Vivi Barrack, MD  Lancets (ACCU-CHEK SAFE-T PRO) lancets Use twice daily as needed to check blood sugar 06/10/18   Vivi Barrack, MD  metFORMIN (GLUCOPHAGE-XR) 500 MG 24 hr tablet Take 2 tablets by mouth twice daily 12/05/19   Vivi Barrack, MD  Multiple Vitamin (MULTIVITAMIN WITH MINERALS) TABS tablet Take 1 tablet by mouth daily. Centrum Silver    [provider]  Omega-3 Fatty Acids (FISH OIL) 1000 MG CPDR Take 1,000 mg by mouth daily.     [provider]  ondansetron (ZOFRAN ODT) 4 MG disintegrating tablet Take 1 tablet (4 mg total) by mouth every 8 (eight) hours as needed for nausea or vomiting.  12/04/19   Montine Circle, PA-C  tadalafil (CIALIS) 20 MG tablet Take 1 tablet (20 mg total) by mouth daily as needed for erectile dysfunction. Patient not taking: Reported on 12/03/2019 02/22/19   Vivi Barrack, MD    Physical Exam: Vitals:   12/07/19 0728 12/07/19 0745 12/07/19 0800 12/07/19 0845  BP: (!) 158/83 (!) 152/84 (!) 153/86 (!) 156/88  Pulse: 74 70 73 72  Resp: '14 14 16 14  '$ Temp:      TempSrc:  SpO2: 98% 99% 97% 99%     . General:  Appears calm and comfortable and is NAD . Eyes:  PERRL, EOMI, normal lids, iris . ENT:  grossly normal hearing, lips & tongue, mmm; appropriate dentition . Neck:  no LAD, masses or thyromegaly; no carotid bruits . Cardiovascular:  RRR, no m/r/g. No LE edema.  Marland Kitchen Respiratory:   CTA bilaterally with no wheezes/rales/rhonchi.  Normal respiratory effort. . Abdomen:  soft, NT, ND, NABS . Back:   normal alignment, no CVAT . Skin:  no rash or induration seen on limited exam . Musculoskeletal:  grossly normal tone BUE/BLE, good ROM, no bony abnormality . Lower extremity:  No LE edema.  Limited foot exam with no ulcerations.  2+ distal pulses. Marland Kitchen Psychiatric:  grossly normal mood and affect, speech fluent and appropriate, AOx3 . Neurologic:  CN 2-12 grossly intact, moves all extremities in coordinated fashion, sensation intact    Radiological Exams on Admission: CT ABDOMEN PELVIS W CONTRAST  Result Date: 12/07/2019 CLINICAL DATA:  Pancreatitis, abdominal pain EXAM: CT ABDOMEN AND PELVIS WITH CONTRAST TECHNIQUE: Multidetector CT imaging of the abdomen and pelvis was performed using the standard protocol following bolus administration of intravenous contrast. CONTRAST:  114m OMNIPAQUE IOHEXOL 300 MG/ML  SOLN COMPARISON:  12/03/2019 FINDINGS: Lower chest: No acute abnormality. Hepatobiliary: Stable 1.4 cm cyst at the right hepatic dome. Liver is otherwise unremarkable in appearance. No new lesion. Gallbladder is unremarkable without gallstone. No  biliary dilatation. Pancreas: Mild enlargement of the pancreatic head with peripancreatic fat stranding, subtly decreased in comparison to prior. Pancreatic parenchyma enhances homogeneously without evidence of necrosis. No organized fluid collection. Spleen: Normal in size without focal abnormality. Adrenals/Urinary Tract: Unremarkable adrenal glands. Stable small right renal cysts. Bilateral extrarenal pelvises. No hydronephrosis. Urinary bladder unremarkable. Stomach/Bowel: Stomach is within normal limits. Appendix not visualized. No evidence of bowel wall thickening, distention, or inflammatory changes. Vascular/Lymphatic: Moderate calcified and noncalcified atherosclerotic plaque throughout the aortoiliac axis. No aneurysm. No abdominopelvic lymphadenopathy. Reproductive: Prostate is unremarkable. Other: Very small fat containing left inguinal hernia. No ascites or pneumoperitoneum. Musculoskeletal: No acute or significant osseous findings. IMPRESSION: Findings consistent with acute pancreatitis involving the pancreatic head. No evidence of pancreatic necrosis or organized fluid collection. The degree of peripancreatic inflammatory changes has slightly decreased compared to recent prior examination 12/03/2019. Aortic Atherosclerosis (ICD10-I70.0). Electronically Signed   By: NDavina PokeD.O.   On: 12/07/2019 08:50    EKG: not done today   Labs on Admission: I have personally reviewed the available labs and imaging studies at the time of the admission.  Pertinent labs:   Glucose 151 Lipase 151 AST 34/ALT 59/GFR >60 WBC 10.7 Respiratory virus panel pending UA: 50 glucose A1c 13.5 in 09/2019   Assessment/Plan Principal Problem:   Pancreatitis Active Problems:   Hypertension associated with diabetes ([place]   Obesity (BMI 30.0-34.9)   T2DM (type 2 diabetes mellitus) ([place]   Hyperlipidemia associated with type 2 diabetes mellitus ([place]   Alcohol dependence ([place]     Pancreatitis -Patient with pancreatitis diagnosed on 3/13, presenting with outpatient treatment failure -Now with frank pancreatitis by H&P, mildly elevated lipase, and CT findings -His LDH is pending, but assuming it is <300, his score is 1 with a mortality risk of 0.9%. -Will observe for now -Strict NPO for now -Aggressive IVF hydration at least for the first 12 hours with LR at 200 cc/hr -Pain control with morphine 2 mg q2h prn.   -Nausea control with Zofran -The  4 most likely causes for pancreatitis include:             -Gallstones - will order RUQ [place].             -Alcohol - most likely etiology - either in isolation or in association with another cause.  See below.             -Medications - He is taking few medications, but HCTZ, Lipitor, and Trulicity can all cause pancreatitis as a severe side effect.  Will hold all.   It may be helpful to slowly them back over time to see if he is able to tolerate - vs. Exchanging for alternative medication.                      -Hypertriglyceridemia - Prior h/o uncontrolled hypertriglyceridemia (498 in 04/2018) but recent check on 3/13 was normal at 104.  DM, Uncontrolled -Will check A1c - at last check, very poorly controlled -hold Glucophage and Trulicity (although not clear if he is taking this, it does increase his risk for pancreatitis) -Cover with moderate-scale SSI  HTN -Continue Norvasc -Hold HCTZ due to increased risk for pancreatitis with this medication  HLD -Hold Lipitor due to increased risk for pancreatitis  Obesity -BMI 32.1 -Weight loss should be encouraged  ETOH dependence -Patient with chronic ETOH dependence -He is at increased risk for complications of withdrawal including seizures, DTs -Will observe -CIWA protocol -SW consult for substance abuse education -Acknowledges remote (1 month) marijuana use; will check UDS     Note: This patient has been tested and is negative for the novel coronavirus  COVID-19.  DVT prophylaxis:  Lovenox  Code Status:  Full - confirmed with patient Family Communication: None present; his sister's telephone number was not available at the time of admission Disposition Plan: He is anticipated to d/c to home without [facility] services once his pancreatitis issues have been resolved. Consults called: TOC team Admission status: It is my clinical opinion that referral for OBSERVATION is reasonable and necessary in this patient based on the above information provided. The aforementioned taken together are felt to place the patient at high risk for further clinical deterioration. However it is anticipated that the patient may be medically stable for discharge from the hospital within 24 to 48 hours.    Karmen Bongo MD [facility]   How to contact the [facility] Attending or Consulting provider [place] or covering provider during after hours [place], for this patient?  1. Check the care team in [facility] and look for a) attending/consulting [facility] provider listed and b) the [facility] team listed 2. Log into [email or web address] and use [place]'s universal password to access. If you do not have the password, please contact the hospital operator. 3. Locate the [facility] provider you are looking for under [facility] and page to a number that you can be directly reached. 4. If you still have difficulty reaching the provider, please page the [facility] (Director on Call) for the Hospitalists listed on amion for assistance.   12/07/2019, 10:59 AM

## 2019-12-07 NOTE — ED Notes (Signed)
Pt ambulated to and from restroom without difficulty.   

## 2019-12-07 NOTE — ED Provider Notes (Signed)
[facility] EMERGENCY DEPARTMENT Provider Note   CSN: [id number] Arrival date & time: 12/06/19  2342     History Chief Complaint  Patient presents with  . Abdominal Pain    Austin Rush is a 59 y.o. male.  59 year old male with prior medical history as detailed below presents for evaluation of abdominal pain discomfort.  Patient is reporting history of chronic pancreatitis.  Patient reports ED evaluation of last week for similar symptoms.  Patient reports he was diagnosed with pancreatitis.  He felt improved following his ED evaluation and went home.  He returns now with continued pain.  He reports that he does not have pain medications at home for his symptoms.  He denies associated fever.  He denies chest pain, shortness of breath, or other complaint.  The history is provided by the patient and medical records.  Abdominal Pain Pain location:  Epigastric Pain quality: aching   Pain radiates to:  Does not radiate Pain severity:  Moderate Onset quality:  Gradual Progression:  Worsening Relieved by:  Nothing Worsened by:  Nothing      Past Medical History:  Diagnosis Date  . Hyperlipidemia   . Hypertension     Patient Active Problem List   Diagnosis Date Noted  . T2DM (type 2 diabetes mellitus) ([place]) 05/13/2018  . Hyperlipidemia associated with type 2 diabetes mellitus ([place]) 05/13/2018  . Former smoker - Candidate for yearly CT 04/27/2018  . Dyshydrosis 04/27/2018  . Erectile dysfunction 05/19/2016  . Vitamin D deficiency 12/29/2014  . Hypertension associated with diabetes ([place]) 12/29/2014    Past Surgical History:  Procedure Laterality Date  . APPENDECTOMY    . COLONOSCOPY N/A 02/22/2016   Procedure: COLONOSCOPY;  Surgeon: Danie Binder, MD;  Location[facility];  Service: Endoscopy;  Laterality: N/A;  1100       Family History  Problem Relation Age of Onset  . Diabetes Mother   . Cancer Father        PROSTATE  . Colon cancer Neg Hx       Social History   Tobacco Use  . Smoking status: Former Smoker    Quit date: 02/21/2011    Years since quitting: 8.7  . Smokeless tobacco: Former Systems developer    Types: Chew  Substance Use Topics  . Alcohol use: Yes    Alcohol/week: 0.0 standard drinks    Comment: Wine about once a month  . Drug use: No    Home Medications Prior to Admission medications   Medication Sig Start Date End Date Taking? Authorizing Provider  amLODipine (NORVASC) 5 MG tablet Take 1 tablet (5 mg total) by mouth daily. 09/29/19   Vivi Barrack, MD  aspirin EC 81 MG tablet Take 81 mg by mouth daily.    [provider]  atorvastatin (LIPITOR) 40 MG tablet Take 1 tablet (40 mg total) by mouth daily. 09/29/19   Vivi Barrack, MD  Blood Glucose Monitoring Suppl (ACCU-CHEK GUIDE) w/Device KIT 1 kit by Does not apply route daily. Use daily as needed to check blood sugar. 05/13/18   Vivi Barrack, MD  canagliflozin [facility]) 300 MG TABS tablet Take 1 tablet (300 mg total) by mouth daily before breakfast. Patient not taking: Reported on 12/03/2019 06/09/19   Vivi Barrack, MD  Continuous Blood Gluc Sensor (FREESTYLE LIBRE SENSOR SYSTEM) MISC Use 4 times daily as needed to check blood sugar. . 02/22/19   Vivi Barrack, MD  Dulaglutide (TRULICITY) 4.09 WJ/1.9JY  SOPN Inject 0.75 mg into the skin once a week. Patient taking differently: Inject 0.75 mg into the skin every Monday.  09/29/19   Vivi Barrack, MD  glucose blood (ACCU-CHEK GUIDE) test strip Use twice daily as needed to check blood sugar. 06/10/18   Vivi Barrack, MD  hydrochlorothiazide (HYDRODIURIL) 12.5 MG tablet Take 1 tablet by mouth once daily 12/05/19   Vivi Barrack, MD  HYDROcodone-acetaminophen (NORCO/VICODIN) 5-325 MG tablet Take 1-2 tablets by mouth every 6 (six) hours as needed. 12/04/19   Montine Circle, PA-C  insulin degludec (TRESIBA FLEXTOUCH) 100 UNIT/ML SOPN FlexTouch Pen Inject 0.25 mLs (25 Units total) into the skin daily. Patient  taking differently: Inject 21 Units into the skin daily.  09/29/19   Vivi Barrack, MD  Insulin Pen Needle (PEN NEEDLES) 32G X 4 MM MISC 1 application by Does not apply route daily. Use daily to inject insulin. 05/13/18   Vivi Barrack, MD  Lancets (ACCU-CHEK SAFE-T PRO) lancets Use twice daily as needed to check blood sugar 06/10/18   Vivi Barrack, MD  metFORMIN (GLUCOPHAGE-XR) 500 MG 24 hr tablet Take 2 tablets by mouth twice daily 12/05/19   Vivi Barrack, MD  Multiple Vitamin (MULTIVITAMIN WITH MINERALS) TABS tablet Take 1 tablet by mouth daily. Centrum Silver    [provider]  Omega-3 Fatty Acids (FISH OIL) 1000 MG CPDR Take 1,000 mg by mouth daily.     [provider]  ondansetron (ZOFRAN ODT) 4 MG disintegrating tablet Take 1 tablet (4 mg total) by mouth every 8 (eight) hours as needed for nausea or vomiting. 12/04/19   Montine Circle, PA-C  tadalafil (CIALIS) 20 MG tablet Take 1 tablet (20 mg total) by mouth daily as needed for erectile dysfunction. Patient not taking: Reported on 12/03/2019 02/22/19   Vivi Barrack, MD    Allergies    Patient has no known allergies.  Review of Systems   Review of Systems  Gastrointestinal: Positive for abdominal pain.  All other systems reviewed and are negative.   Physical Exam Updated Vital Signs BP (!) 158/83   Pulse 74   Temp 98.3 F (36.8 C) (Oral)   Resp 14   SpO2 98%   Physical Exam Vitals and nursing note reviewed.  Constitutional:      General: He is not in acute distress.    Appearance: He is well-developed.  HENT:     Head: Normocephalic and atraumatic.  Eyes:     Conjunctiva/sclera: Conjunctivae normal.     Pupils: Pupils are equal, round, and reactive to light.  Cardiovascular:     Rate and Rhythm: Normal rate and regular rhythm.     Heart sounds: Normal heart sounds.  Pulmonary:     Effort: Pulmonary effort is normal. No respiratory distress.     Breath sounds: Normal breath sounds.    Abdominal:     General: There is no distension.     Palpations: Abdomen is soft.     Tenderness: There is abdominal tenderness in the epigastric area.  Musculoskeletal:        General: No deformity. Normal range of motion.     Cervical back: Normal range of motion and neck supple.  Skin:    General: Skin is warm and dry.  Neurological:     Mental Status: He is alert and oriented to person, place, and time.     ED Results / Procedures / Treatments   Labs (all labs ordered are  listed, but only abnormal results are displayed) Labs Reviewed  LIPASE, BLOOD - Abnormal; Notable for the following components:      Result Value   Lipase 151 (*)    All other components within normal limits  COMPREHENSIVE METABOLIC PANEL - Abnormal; Notable for the following components:   Glucose, Bld 151 (*)    ALT 59 (*)    All other components within normal limits  CBC - Abnormal; Notable for the following components:   WBC 10.7 (*)    All other components within normal limits  URINALYSIS, ROUTINE W REFLEX MICROSCOPIC - Abnormal; Notable for the following components:   Glucose, UA 50 (*)    All other components within normal limits  RESPIRATORY PANEL BY RT PCR (FLU A&B, COVID)    EKG None  Radiology CT ABDOMEN PELVIS W CONTRAST  Result Date: 12/07/2019 CLINICAL DATA:  Pancreatitis, abdominal pain EXAM: CT ABDOMEN AND PELVIS WITH CONTRAST TECHNIQUE: Multidetector CT imaging of the abdomen and pelvis was performed using the standard protocol following bolus administration of intravenous contrast. CONTRAST:  163m OMNIPAQUE IOHEXOL 300 MG/ML  SOLN COMPARISON:  12/03/2019 FINDINGS: Lower chest: No acute abnormality. Hepatobiliary: Stable 1.4 cm cyst at the right hepatic dome. Liver is otherwise unremarkable in appearance. No new lesion. Gallbladder is unremarkable without gallstone. No biliary dilatation. Pancreas: Mild enlargement of the pancreatic head with peripancreatic fat stranding, subtly  decreased in comparison to prior. Pancreatic parenchyma enhances homogeneously without evidence of necrosis. No organized fluid collection. Spleen: Normal in size without focal abnormality. Adrenals/Urinary Tract: Unremarkable adrenal glands. Stable small right renal cysts. Bilateral extrarenal pelvises. No hydronephrosis. Urinary bladder unremarkable. Stomach/Bowel: Stomach is within normal limits. Appendix not visualized. No evidence of bowel wall thickening, distention, or inflammatory changes. Vascular/Lymphatic: Moderate calcified and noncalcified atherosclerotic plaque throughout the aortoiliac axis. No aneurysm. No abdominopelvic lymphadenopathy. Reproductive: Prostate is unremarkable. Other: Very small fat containing left inguinal hernia. No ascites or pneumoperitoneum. Musculoskeletal: No acute or significant osseous findings. IMPRESSION: Findings consistent with acute pancreatitis involving the pancreatic head. No evidence of pancreatic necrosis or organized fluid collection. The degree of peripancreatic inflammatory changes has slightly decreased compared to recent prior examination 12/03/2019. Aortic Atherosclerosis (ICD10-I70.0). Electronically Signed   By: NDavina PokeD.O.   On: 12/07/2019 08:50    Procedures Procedures (including critical care time)  Medications Ordered in ED Medications  sodium chloride flush (NS) 0.9 % injection 3 mL (has no administration in time range)  sodium chloride 0.9 % bolus 1,000 mL (has no administration in time range)  morphine 4 MG/ML injection 4 mg (has no administration in time range)    ED Course  I have reviewed the triage vital signs and the nursing notes.  Pertinent labs & imaging results that were available during my care of the patient were reviewed by me and considered in my medical decision making (see chart for details).    MDM Rules/Calculators/A&P                      MDM  Screen complete  Austin Rush evaluated in  Emergency Department on 12/07/2019 for the symptoms described in the history of present illness. He was evaluated in the context of the global COVID-19 pandemic, which necessitated consideration that the patient might be at risk for infection with the SARS-CoV-2 virus that causes COVID-19. Institutional protocols and algorithms that pertain to the evaluation of patients at risk for COVID-19 are in a state of rapid change based  on information released by regulatory bodies including the CDC and federal and state organizations. These policies and algorithms were followed during the patient's care in the ED.  Patient is presenting for evaluation of abdominal pain secondary to presumed pancreatitis.  Patient reports prior history of same.  Symptoms have failed to improve with trial of outpatient.  Patient is requesting admission for control of his pain.  Hospitalist service -- [facility] -- is aware of case and will evaluate for admission.  Final Clinical Impression(s) / ED Diagnoses Final diagnoses:  Acute pancreatitis, unspecified complication status, unspecified pancreatitis type    Rx / DC Orders ED Discharge Orders    None       Valarie Merino, MD 12/07/19 [id number]

## 2019-12-07 NOTE — ED Notes (Signed)
Pt transported to ultrasound.

## 2019-12-08 ENCOUNTER — Encounter (HOSPITAL_COMMUNITY): Payer: Self-pay | Admitting: Internal Medicine

## 2019-12-08 DIAGNOSIS — Z794 Long term (current) use of insulin: Secondary | ICD-10-CM | POA: Diagnosis not present

## 2019-12-08 DIAGNOSIS — I700 Atherosclerosis of aorta: Secondary | ICD-10-CM

## 2019-12-08 DIAGNOSIS — K852 Alcohol induced acute pancreatitis without necrosis or infection: Secondary | ICD-10-CM | POA: Diagnosis not present

## 2019-12-08 DIAGNOSIS — F1020 Alcohol dependence, uncomplicated: Secondary | ICD-10-CM | POA: Diagnosis not present

## 2019-12-08 DIAGNOSIS — E1165 Type 2 diabetes mellitus with hyperglycemia: Secondary | ICD-10-CM | POA: Diagnosis not present

## 2019-12-08 DIAGNOSIS — F1010 Alcohol abuse, uncomplicated: Secondary | ICD-10-CM | POA: Diagnosis not present

## 2019-12-08 HISTORY — DX: Atherosclerosis of aorta: I70.0

## 2019-12-08 LAB — CBC
HCT: 42.2 % (ref 39.0–52.0)
Hemoglobin: 14.6 g/dL (ref 13.0–17.0)
MCH: 28.5 pg (ref 26.0–34.0)
MCHC: 34.6 g/dL (ref 30.0–36.0)
MCV: 82.3 fL (ref 80.0–100.0)
Platelets: 298 10*3/uL (ref 150–400)
RBC: 5.13 MIL/uL (ref 4.22–5.81)
RDW: 12.6 % (ref 11.5–15.5)
WBC: 8.5 10*3/uL (ref 4.0–10.5)
nRBC: 0 % (ref 0.0–0.2)

## 2019-12-08 LAB — COMPREHENSIVE METABOLIC PANEL
ALT: 42 U/L (ref 0–44)
AST: 20 U/L (ref 15–41)
Albumin: 3.3 g/dL — ABNORMAL LOW (ref 3.5–5.0)
Alkaline Phosphatase: 62 U/L (ref 38–126)
Anion gap: 11 (ref 5–15)
BUN: 5 mg/dL — ABNORMAL LOW (ref 6–20)
CO2: 25 mmol/L (ref 22–32)
Calcium: 9.4 mg/dL (ref 8.9–10.3)
Chloride: 102 mmol/L (ref 98–111)
Creatinine, Ser: 0.74 mg/dL (ref 0.61–1.24)
GFR calc Af Amer: >60 mL/min (ref >60)
GFR calc non Af Amer: >60 mL/min (ref >60)
Glucose, Bld: 134 mg/dL — ABNORMAL HIGH (ref 70–99)
Potassium: 4.3 mmol/L (ref 3.5–5.1)
Sodium: 138 mmol/L (ref 135–145)
Total Bilirubin: 0.9 mg/dL (ref 0.3–1.2)
Total Protein: 6.4 g/dL — ABNORMAL LOW (ref 6.5–8.1)

## 2019-12-08 LAB — GLUCOSE, CAPILLARY
Glucose-Capillary: 108 mg/dL — ABNORMAL HIGH (ref 70–99)
Glucose-Capillary: 101 mg/dL — ABNORMAL HIGH (ref 70–99)
Glucose-Capillary: 195 mg/dL — ABNORMAL HIGH (ref 70–99)
Glucose-Capillary: 86 mg/dL (ref 70–99)

## 2019-12-08 LAB — LIPASE, BLOOD: Lipase: 79 U/L — ABNORMAL HIGH (ref 11–51)

## 2019-12-08 MED ORDER — POLYETHYLENE GLYCOL 3350 17 G PO PACK
17.0000 g | PACK | Freq: Two times a day (BID) | ORAL | Status: DC
  Administered 2019-12-08: 17 g via ORAL
  Filled 2019-12-08 (×2): qty 1

## 2019-12-08 NOTE — Plan of Care (Signed)
  Problem: Education: Goal: Knowledge of General Education information will improve Description: Including pain rating scale, medication(s)/side effects and non-pharmacologic comfort measures Outcome: Progressing   Problem: Clinical Measurements: Goal: Respiratory complications will improve Outcome: Progressing Note: On room air   Problem: Activity: Goal: Risk for activity intolerance will decrease Outcome: Progressing Note: Up independently in room tolerating well   Problem: Nutrition: Goal: Adequate nutrition will be maintained Outcome: Progressing   Problem: Coping: Goal: Level of anxiety will decrease Outcome: Progressing   Problem: Elimination: Goal: Will not experience complications related to bowel motility Outcome: Progressing Note: BM this shift Goal: Will not experience complications related to urinary retention Outcome: Progressing   Problem: Pain Managment: Goal: General experience of comfort will improve Outcome: Progressing Note: No complaints of pain   Problem: Safety: Goal: Ability to remain free from injury will improve Outcome: Progressing

## 2019-12-08 NOTE — Progress Notes (Signed)
PROGRESS NOTE  Austin Rush ZOX:[id number] DOB: 1961/05/01 DOA: 12/07/2019 PCP: Vivi Barrack, MD  Brief History   59 year old man with daily alcohol use presented with acute pancreatitis.  A & P  Acute pancreatitis with elevated lipase and CT findings noted --Most likely secondary to daily alcohol consumption.  Triglycerides unremarkable.  No anatomic abnormality noted. --Although numerous medications can cause pancreatitis, but I think his medications are less likely than alcohol. --Lipase trending down.  LFTs unremarkable.  Alcohol dependence --Continue CIWA.  No evidence of withdrawal.  --Social work consultation  Urine drug screen positive for marijuana --Recommend cessation  Diabetes mellitus type 2 uncontrolled.  Hemoglobin A1c 8.2. --CBG stable.  Can resume metformin on discharge  Essential hypertension --Stable.  Continue amlodipine.  Hyperlipidemia --Resume statin on discharge  Aortic atherosclerosis --Continue statin  Disposition Plan:  From: home Anticipated disposition: home Discussion: Admitted for acute pancreatitis, symptomatically much better today.  Plan to start clears and advance diet to full liquids for dinner if tolerates.  Tomorrow will advance to soft diet if tolerates and anticipate discharge home then if tolerated.  DVT prophylaxis: enoxaparin Code Status: Full Family Communication: none present; none requested.  Patient alert and oriented and appears to understand plan.    Murray Hodgkins, MD  [facility] Direct contact: see [email or web address] (further directions at bottom of note if needed) 7PM-7AM contact night coverage as at bottom of note 12/08/2019, 3:17 PM  LOS: 0 days   Significant Hospital Events   . 3/17 admitted for acute pancreatitis   Consults:  .    Procedures:  .   Significant Diagnostic Tests:  . Right upper quadrant ultrasound no acute abnormalities . CT abdomen pelvis consistent with acute pancreatitis involving  the pancreatic head.  No pancreatic necrosis or organized fluid collection noted.   Micro Data:  .    Antimicrobials:  .   Interval History/Subjective  Feeling much better today.  No abdominal pain nausea or vomiting now.  Objective   Vitals:  Vitals:   12/08/19 1113 12/08/19 1344  BP: 121/87 (!) 152/78  Pulse:  73  Resp:    Temp:  98.3 F (36.8 C)  SpO2:  95%    Exam:  Constitutional.  Appears calm, comfortable. Respiratory.  Clear to auscultation bilaterally.  No wheezes, rales or rhonchi.  Normal respiratory effort. Cardiovascular.  Regular rate and rhythm.  No murmur, rub or gallop. Abdomen.  Soft, nontender, nondistended. Psychiatric.  Grossly normal mood and affect.  Speech fluent and appropriate.  I have personally reviewed the following:   Today's Data  . Basic metabolic panel unremarkable.  Lipase trending down at 79.  LFTs unremarkable. . CBC unremarkable  Scheduled Meds: . amLODipine  5 mg Oral Daily  . aspirin EC  81 mg Oral Daily  . enoxaparin (LOVENOX) injection  40 mg Subcutaneous Q24H  . folic acid  1 mg Oral Daily  . insulin aspart  0-15 Units Subcutaneous TID WC  . insulin aspart  0-5 Units Subcutaneous QHS  . multivitamin with minerals  1 tablet Oral Daily  . polyethylene glycol  17 g Oral BID  . thiamine  100 mg Oral Daily   Or  . thiamine  100 mg Intravenous Daily   Continuous Infusions: . lactated ringers 200 mL/hr at 12/08/19 1451    Principal Problem:   Pancreatitis Active Problems:   Hypertension associated with diabetes ([place])   Obesity (BMI 30.0-34.9)   T2DM (type 2 diabetes mellitus) ([place])   Hyperlipidemia associated  with type 2 diabetes mellitus (H[facility])   Alcohol dependence (HCC)   Aortic atherosclerosis (HCC)   LOS: 0 days   How to contact the [facility] Attending or Consulting provider 7A - 7P or covering provider during after hours 7P -7A, for this patient?  1. Check the care team in [facility] and look for a) attending/consulting TRH  provider listed and b) the [facility] team listed 2. Log into [email or web address] and use [place]'s universal password to access. If you do not have the password, please contact the hospital operator. 3. Locate the [facility] provider you are looking for under [facility] and page to a number that you can be directly reached. 4. If you still have difficulty reaching the provider, please page the [facility] (Director on Call) for the Hospitalists listed on amion for assistance.

## 2019-12-09 DIAGNOSIS — F1010 Alcohol abuse, uncomplicated: Secondary | ICD-10-CM | POA: Diagnosis not present

## 2019-12-09 DIAGNOSIS — Z794 Long term (current) use of insulin: Secondary | ICD-10-CM | POA: Diagnosis not present

## 2019-12-09 DIAGNOSIS — K852 Alcohol induced acute pancreatitis without necrosis or infection: Secondary | ICD-10-CM | POA: Diagnosis not present

## 2019-12-09 DIAGNOSIS — E1165 Type 2 diabetes mellitus with hyperglycemia: Secondary | ICD-10-CM | POA: Diagnosis not present

## 2019-12-09 LAB — GLUCOSE, CAPILLARY
Glucose-Capillary: 135 mg/dL — ABNORMAL HIGH (ref 70–99)
Glucose-Capillary: 88 mg/dL (ref 70–99)

## 2019-12-09 NOTE — TOC Transition Note (Signed)
Transition of Care [facility]) - CM/SW Discharge Note   Patient Details  Name: Cade Dashner MRN: [id number] Date of Birth: [birth year]  Transition of Care [facility]) CM/SW Contact:  Epifanio Lesches, RN Phone Number: 12/09/2019, 1:38 PM   Clinical Narrative:    Admitted with pancreatitis. Hx of HTN and HLD. From home with sister. Independent with ADL's, no DME usage. TOC received consult :Substance Abuse Couseling/Education- please comment  Pt declined.  Stating, " I know what my problem is. I'M GOOD". Pt will transition to home with self care today. Family to provide transportation to home.  Final next level of care: Home w Home Health Services Barriers to Discharge: No Barriers Identified   Patient Goals and CMS Choice        Discharge Placement                       Discharge Plan and Services                                     Social Determinants of Health (SDOH) Interventions     Readmission Risk Interventions No flowsheet data found.

## 2019-12-09 NOTE — Discharge Summary (Addendum)
Physician Discharge Summary  Austin Rush [id number]:[id number] DOB: 1961/08/22 DOA: 12/07/2019  PCP: Vivi Barrack, MD  Admit date: 12/07/2019 Discharge date: 12/09/2019  Recommendations for Outpatient Follow-up:  1. Resolution of acute pancreatitis.  Continue to encourage abstinence from alcohol.  Follow-up Information    Vivi Barrack, MD. Schedule an appointment as soon as possible for a visit in 2 week(s).   Specialty: Family Medicine Contact information: [address] [place] [address] [phone]            Discharge Diagnoses: Principal diagnosis is #1 1. Acute pancreatitis  2. Alcohol abuse 3. Urine drug screen positive for marijuana 4. Diabetes mellitus type 2 uncontrolled 5. Essential hypertension 6. Hyperlipidemia 7. Aortic atherosclerosis  Discharge Condition: improved Disposition: home  Diet recommendation: low fat diet  Filed Weights   12/07/19 2045  Weight: 92 kg    History of present illness:  59 year old man presented with acute pancreatitis.  History of daily alcohol use.  Hospital Course:  Patient was treated with conservative management with rapid clinical improvement.  No evidence of complicating.  Sugars.  Tolerate diet was discharged home in good condition.  He was counseled on the relationship between alcohol and pancreatitis and abstinence was encouraged.  Patient understood this.  Although his medications could cause pancreatitis I think is less likely than alcohol and therefore have restarted his medications.  Should he have another bout, would consider discontinuation of these medications.  Acute pancreatitis with elevated lipase and CT findings noted --Most likely secondary to daily alcohol consumption.  Triglycerides unremarkable.  No anatomic abnormality noted. --Although numerous medications can cause pancreatitis, but I think his medications are less likely than alcohol. --Lipase trending down.  LFTs unremarkable.  Alcohol  abuse --Continue CIWA.  No evidence of withdrawal.  --Transition of care consultation appreciated  Urine drug screen positive for marijuana --Recommend cessation  Diabetes mellitus type 2 uncontrolled.  Hemoglobin A1c 8.2. --CBG stable.  Can resume metformin on discharge  Essential hypertension --Stable.  Continue amlodipine.  Hyperlipidemia --Resume statin on discharge  Aortic atherosclerosis --Continue statin  Significant Hospital Events    3/17 admitted for acute pancreatitis  Consults:     Procedures:     Significant Diagnostic Tests:   Right upper quadrant ultrasound no acute abnormalities  CT abdomen pelvis consistent with acute pancreatitis involving the pancreatic head.  No pancreatic necrosis or organized fluid collection noted.  Today's assessment: S: Feels much better today, no complaints.  Tolerating diet.  No abdominal pain. O: Vitals:  Vitals:   12/09/19 0444 12/09/19 0931  BP: 126/80 (!) 143/78  Pulse: 67 72  Resp: 15 18  Temp: 98.4 F (36.9 C) 98.7 F (37.1 C)  SpO2: 99% 99%    Constitutional:  . Appears calm and comfortable Respiratory:  . CTA bilaterally, no w/r/r.  . Respiratory effort normal.  Cardiovascular:  . RRR, no m/r/g Abdomen:  . Soft Psychiatric:  . Mental status o Mood, affect appropriate  CBG stable  Discharge Instructions  Discharge Instructions    Activity as tolerated - No restrictions   Complete by: As directed    Discharge instructions   Complete by: As directed    Call your physician or seek immediate medical attention for pain, vomiting, inability to eat, fever or worsening of condition. Stop drinking alcohol, it can cause pancreatitis. Eat a low fat diet for at least a week and gradually reintroduce higher fat foods.     Allergies as of 12/09/2019   No  Known Allergies     Medication List    STOP taking these medications   Trulicity 9.47 SJ/[id number] Sopn Generic drug: Dulaglutide      TAKE these medications   Accu-Chek Guide w/Device Kit 1 kit by Does not apply route daily. Use daily as needed to check blood sugar.   accu-chek safe-t pro lancets Use twice daily as needed to check blood sugar   acetaminophen 500 MG tablet Commonly known as: TYLENOL Take 1,000 mg by mouth every 6 (six) hours as needed for mild pain.   amLODipine 5 MG tablet Commonly known as: NORVASC Take 1 tablet (5 mg total) by mouth daily.   aspirin EC 81 MG tablet Take 81 mg by mouth daily.   atorvastatin 40 MG tablet Commonly known as: LIPITOR Take 1 tablet (40 mg total) by mouth daily.   Fish Oil 1000 MG Cpdr Take 1,000 mg by mouth daily.   FreeStyle [employer] Use 4 times daily as needed to check blood sugar. Marland Kitchen   glucose blood test strip Commonly known as: Accu-Chek Guide Use twice daily as needed to check blood sugar.   hydrochlorothiazide 12.5 MG tablet Commonly known as: HYDRODIURIL Take 1 tablet by mouth once daily   metFORMIN 500 MG 24 hr tablet Commonly known as: GLUCOPHAGE-XR Take 2 tablets by mouth twice daily   multivitamin with minerals Tabs tablet Take 1 tablet by mouth daily. Centrum Silver   ondansetron 4 MG disintegrating tablet Commonly known as: Zofran ODT Take 1 tablet (4 mg total) by mouth every 8 (eight) hours as needed for nausea or vomiting.   Pen Needles 32G X 4 MM Misc 1 application by Does not apply route daily. Use daily to inject insulin.      No Known Allergies  The results of significant diagnostics from this hospitalization (including imaging, microbiology, ancillary and laboratory) are listed below for reference.    Significant Diagnostic Studies: CT ABDOMEN PELVIS W CONTRAST  Result Date: 12/07/2019 CLINICAL DATA:  Pancreatitis, abdominal pain EXAM: CT ABDOMEN AND PELVIS WITH CONTRAST TECHNIQUE: Multidetector CT imaging of the abdomen and pelvis was performed using the standard protocol following bolus administration of  intravenous contrast. CONTRAST:  1106m OMNIPAQUE IOHEXOL 300 MG/ML  SOLN COMPARISON:  12/03/2019 FINDINGS: Lower chest: No acute abnormality. Hepatobiliary: Stable 1.4 cm cyst at the right hepatic dome. Liver is otherwise unremarkable in appearance. No new lesion. Gallbladder is unremarkable without gallstone. No biliary dilatation. Pancreas: Mild enlargement of the pancreatic head with peripancreatic fat stranding, subtly decreased in comparison to prior. Pancreatic parenchyma enhances homogeneously without evidence of necrosis. No organized fluid collection. Spleen: Normal in size without focal abnormality. Adrenals/Urinary Tract: Unremarkable adrenal glands. Stable small right renal cysts. Bilateral extrarenal pelvises. No hydronephrosis. Urinary bladder unremarkable. Stomach/Bowel: Stomach is within normal limits. Appendix not visualized. No evidence of bowel wall thickening, distention, or inflammatory changes. Vascular/Lymphatic: Moderate calcified and noncalcified atherosclerotic plaque throughout the aortoiliac axis. No aneurysm. No abdominopelvic lymphadenopathy. Reproductive: Prostate is unremarkable. Other: Very small fat containing left inguinal hernia. No ascites or pneumoperitoneum. Musculoskeletal: No acute or significant osseous findings. IMPRESSION: Findings consistent with acute pancreatitis involving the pancreatic head. No evidence of pancreatic necrosis or organized fluid collection. The degree of peripancreatic inflammatory changes has slightly decreased compared to recent prior examination 12/03/2019. Aortic Atherosclerosis (ICD10-I70.0). Electronically Signed   By: NDavina PokeD.O.   On: 12/07/2019 08:50   CT ABDOMEN PELVIS W CONTRAST  Result Date: 12/03/2019 CLINICAL DATA:  Lower abdominal  pain x4 days. EXAM: CT ABDOMEN AND PELVIS WITH CONTRAST TECHNIQUE: Multidetector CT imaging of the abdomen and pelvis was performed using the standard protocol following bolus administration of  intravenous contrast. CONTRAST:  130m OMNIPAQUE IOHEXOL 300 MG/ML  SOLN COMPARISON:  October 08, 2018. FINDINGS: Lower chest: No acute abnormality. Hepatobiliary: A stable 1.5 cm cystic appearing areas seen within the anterior aspect of the liver dome. No gallstones, gallbladder wall thickening, or biliary dilatation. Pancreas: Very mild peripancreatic inflammatory fat stranding is seen surrounding and enlarged pancreatic head. Spleen: Normal in size without focal abnormality. Adrenals/Urinary Tract: Adrenal glands are unremarkable. Kidneys are normal, without renal calculi or hydronephrosis. A stable 1.5 cm simple cyst is seen along the anterior aspect of the mid right kidney. An additional stable 0.9 cm cyst is seen within the lower pole of the right kidney. Bladder is unremarkable. Stomach/Bowel: Stomach is within normal limits. The appendix is not clearly identified. No evidence of bowel wall thickening, distention, or inflammatory changes. Vascular/Lymphatic: Moderate severity aortic atherosclerosis. No enlarged abdominal or pelvic lymph nodes. Reproductive: Prostate is unremarkable. Other: There is a stable 2.6 cm x 2.0 cm fat containing left inguinal hernia. Musculoskeletal: No acute or significant osseous findings. IMPRESSION: 1. Findings consistent with mild acute pancreatitis. Correlation with pancreatic enzymes is recommended. 2. Small, stable hepatic and renal cysts. Electronically Signed   By: TVirgina NorfolkM.D.   On: 12/03/2019 23:05   UKoreaAbdomen Limited RUQ  Result Date: 12/07/2019 CLINICAL DATA:  Pancreatitis. EXAM: ULTRASOUND ABDOMEN LIMITED RIGHT UPPER QUADRANT COMPARISON:  CT scan of the abdomen and pelvis dated 12/07/2019 FINDINGS: Gallbladder: No gallstones or wall thickening visualized. No sonographic Murphy sign noted by sonographer. Common bile duct: Diameter: 3.1 mm, normal. Liver: 1.6 cm cyst in the left lobe of the liver. No other focal liver lesions. Within normal limits in  parenchymal echogenicity. Portal vein is patent on color Doppler imaging with normal direction of blood flow towards the liver. Other: None. IMPRESSION: No significant abnormalities. Electronically Signed   By: JLorriane ShireM.D.   On: 12/07/2019 11:24    Microbiology: Recent Results (from the past 240 hour(s))  Respiratory Panel by RT PCR (Flu A&B, Covid) - Nasopharyngeal Swab     Status: None   Collection Time: 12/07/19  8:01 AM   Specimen: Nasopharyngeal Swab  Result Value Ref Range Status   SARS Coronavirus 2 by RT PCR NEGATIVE NEGATIVE Final    Comment: (NOTE) SARS-CoV-2 target nucleic acids are NOT DETECTED. The SARS-CoV-2 RNA is generally detectable in upper respiratoy specimens during the acute phase of infection. The lowest concentration of SARS-CoV-2 viral copies this assay can detect is 131 copies/mL. A negative result does not preclude SARS-Cov-2 infection and should not be used as the sole basis for treatment or other patient management decisions. A negative result may occur with  improper specimen collection/handling, submission of specimen other than nasopharyngeal swab, presence of viral mutation(s) within the areas targeted by this assay, and inadequate number of viral copies (<131 copies/mL). A negative result must be combined with clinical observations, patient history, and epidemiological information. The expected result is Negative. Fact Sheet for Patients:  hPinkCheek.beFact Sheet for Healthcare Providers:  hGravelBags.itThis test is not yet ap proved or cleared by the [place] and  has been authorized for detection and/or diagnosis of SARS-CoV-2 by FDA under an Emergency Use Authorization (EUA). This EUA will remain  in effect (meaning this test can be used) for the duration of  the COVID-19 declaration under Section 564(b)(1) of the Act, 21 U.S.C. section 360bbb-3(b)(1), unless the authorization  is terminated or revoked sooner.    Influenza A by PCR NEGATIVE NEGATIVE Final   Influenza B by PCR NEGATIVE NEGATIVE Final    Comment: (NOTE) The Xpert Xpress SARS-CoV-2/FLU/RSV assay is intended as an aid in  the diagnosis of influenza from Nasopharyngeal swab specimens and  should not be used as a sole basis for treatment. Nasal washings and  aspirates are unacceptable for Xpert Xpress SARS-CoV-2/FLU/RSV  testing. Fact Sheet for Patients: PinkCheek.be Fact Sheet for Healthcare Providers: GravelBags.it This test is not yet approved or cleared by the [place] FDA and  has been authorized for detection and/or diagnosis of SARS-CoV-2 by  FDA under an Emergency Use Authorization (EUA). This EUA will remain  in effect (meaning this test can be used) for the duration of the  Covid-19 declaration under Section 564(b)(1) of the Act, 21  U.S.C. section 360bbb-3(b)(1), unless the authorization is  terminated or revoked. Performed at [facility], [place] [address] [place], [place] [address]      Labs: Basic Metabolic Panel: Recent Labs  Lab 12/03/19 1950 12/07/19 0035 12/08/19 0415  NA 138 138 138  K 4.4 4.2 4.3  CL 105 102 102  CO2 _0 GLUCOSE 138* 151* 134*  BUN 10 9 5*  CREATININE 0.74 0.84 0.74  CALCIUM 9.5 9.5 9.4   Liver Function Tests: Recent Labs  Lab 12/03/19 1950 12/07/19 0035 12/08/19 0415  AST 26 34 20  ALT 56* 59* 42  ALKPHOS 54 58 62  BILITOT 0.9 0.4 0.9  PROT 6.9 7.3 6.4*  ALBUMIN 3.9 3.6 3.3*   Recent Labs  Lab 12/03/19 1950 12/07/19 0035 12/08/19 0415  LIPASE 217* 151* 79*   CBC: Recent Labs  Lab 12/03/19 1950 12/07/19 0035 12/08/19 0415  WBC 12.6* 10.7* 8.5  HGB 14.7 14.8 14.6  HCT 41.7 43.2 42.2  MCV 83.2 83.7 82.3  PLT 280 301 298    CBG: Recent Labs  Lab 12/08/19 1342 12/08/19 1800 12/08/19 2145 12/09/19 0638 12/09/19 1139  GLUCAP 101* 195* 86 135*  88    Principal Problem:   Pancreatitis Active Problems:   Hypertension associated with diabetes (HCC)   Obesity (BMI 30.0-34.9)   T2DM (type 2 diabetes mellitus) ([place])   Hyperlipidemia associated with type 2 diabetes mellitus ([place])   Alcohol dependence ([place])   Aortic atherosclerosis ([place])   Time coordinating discharge: 35 minutes  Signed:  Murray Hodgkins, MD  [facility]  12/09/2019, 3:33 PM

## 2019-12-09 NOTE — Progress Notes (Signed)
Discharge instructions addressed; Pt in stable condition;will drive home by himself today.

## 2019-12-14 ENCOUNTER — Telehealth: Payer: Self-pay

## 2019-12-14 NOTE — Telephone Encounter (Signed)
Late Entry for 12/13/19  Transition Care Management Follow-up Telephone Call  Date of discharge and from where: [facility] 12/09/19  How have you been since you were released from the hospital? Stable   Any questions or concerns? No   Items Reviewed:  Did the pt receive and understand the discharge instructions provided? Yes   Medications obtained and verified? Yes   Any new allergies since your discharge? No   Dietary orders reviewed? Yes  Do you have support at home? Yes   Functional Questionnaire: (I = Independent and D = Dependent) ADLs: I  Bathing/Dressing- I  Meal Prep- I  Eating- I  Maintaining continence- I  Transferring/Ambulation- I  Managing Meds- I  Follow up appointments reviewed:   PCP Hospital f/u appt confirmed? Yes  Scheduled to see Dr. Jimmey Ralph on 12/19/19 @ 2:20.  Specialist Hospital f/u appt confirmed? n/a    Are transportation arrangements needed? No   If their condition worsens, is the pt aware to call PCP or go to the Emergency Dept.? Yes  Was the patient provided with contact information for the PCP's office or ED? Yes  Was to pt encouraged to call back with questions or concerns? Yes

## 2019-12-19 ENCOUNTER — Other Ambulatory Visit: Payer: Self-pay

## 2019-12-19 ENCOUNTER — Encounter: Payer: Self-pay | Admitting: Family Medicine

## 2019-12-19 ENCOUNTER — Ambulatory Visit: Payer: BC Managed Care – PPO | Admitting: Family Medicine

## 2019-12-19 VITALS — BP 138/68 | HR 95 | Temp 98.2°F | Ht 66.0 in | Wt 205.5 lb

## 2019-12-19 DIAGNOSIS — E1165 Type 2 diabetes mellitus with hyperglycemia: Secondary | ICD-10-CM

## 2019-12-19 DIAGNOSIS — I10 Essential (primary) hypertension: Secondary | ICD-10-CM

## 2019-12-19 DIAGNOSIS — E785 Hyperlipidemia, unspecified: Secondary | ICD-10-CM

## 2019-12-19 DIAGNOSIS — K852 Alcohol induced acute pancreatitis without necrosis or infection: Secondary | ICD-10-CM

## 2019-12-19 DIAGNOSIS — I152 Hypertension secondary to endocrine disorders: Secondary | ICD-10-CM

## 2019-12-19 DIAGNOSIS — Z8719 Personal history of other diseases of the digestive system: Secondary | ICD-10-CM

## 2019-12-19 DIAGNOSIS — E1169 Type 2 diabetes mellitus with other specified complication: Secondary | ICD-10-CM

## 2019-12-19 DIAGNOSIS — E1159 Type 2 diabetes mellitus with other circulatory complications: Secondary | ICD-10-CM | POA: Diagnosis not present

## 2019-12-19 DIAGNOSIS — Z794 Long term (current) use of insulin: Secondary | ICD-10-CM

## 2019-12-19 MED ORDER — METFORMIN HCL ER 500 MG PO TB24: 1000.0000 mg | ORAL_TABLET | Freq: Two times a day (BID) | ORAL | 3 refills | Status: AC

## 2019-12-19 MED ORDER — AMLODIPINE BESYLATE 5 MG PO TABS: 5.0000 mg | ORAL_TABLET | Freq: Every day | ORAL | 3 refills | Status: AC

## 2019-12-19 MED ORDER — ATORVASTATIN CALCIUM 40 MG PO TABS: 40.0000 mg | ORAL_TABLET | Freq: Every day | ORAL | 3 refills | Status: AC

## 2019-12-19 MED ORDER — TRESIBA FLEXTOUCH 100 UNIT/ML ~~LOC~~ SOPN: 22.0000 [IU] | PEN_INJECTOR | Freq: Every day | SUBCUTANEOUS | 3 refills | Status: AC

## 2019-12-19 MED ORDER — CANAGLIFLOZIN 300 MG PO TABS: 300.0000 mg | ORAL_TABLET | Freq: Every day | ORAL | 3 refills | Status: AC

## 2019-12-19 MED ORDER — HYDROCHLOROTHIAZIDE 12.5 MG PO TABS: 12.5000 mg | ORAL_TABLET | Freq: Every day | ORAL | 3 refills | Status: AC

## 2019-12-19 NOTE — Assessment & Plan Note (Signed)
At goal.  Will refill amlodipine 5 mg daily and HCTZ 12.5 mg daily.

## 2019-12-19 NOTE — Patient Instructions (Signed)
It was very nice to see you today!  I am glad that you are feeling better.  I will send in refills for all of your medications.  Please schedule appointment with a doctor in [place] as soon as you can.  Take care, Dr Jimmey Ralph  Please try these tips to maintain a healthy lifestyle:   Eat at least 3 REAL meals and 1-2 snacks per day.  Aim for no more than 5 hours between eating.  If you eat breakfast, please do so within one hour of getting up.    Each meal should contain half fruits/vegetables, one quarter protein, and one quarter carbs (no bigger than a computer mouse)   Cut down on sweet beverages. This includes juice, soda, and sweet tea.     Drink at least 1 glass of water with each meal and aim for at least 8 glasses per day   Exercise at least 150 minutes every week.

## 2019-12-19 NOTE — Progress Notes (Signed)
Chief Complaint:  Austin Rush is a 59 y.o. male who presents today for a TCM visit.  Assessment/Plan:  New/Acute Problems: Pancreatitis Resolved.  Unclear if this is due to medications or alcohol use.  He has not taking Trulicity anymore.  Will need to avoid GLP-1 agonist and DPP 4 inhibitors in the future.  Continue alcohol abstinence.  Chronic Problems Addressed Today: Hypertension associated with diabetes ([place]) At goal.  Will refill amlodipine 5 mg daily and HCTZ 12.5 mg daily.  Hyperlipidemia associated with type 2 diabetes mellitus ([facility]) Stable.  Continue atorvastatin 40 mg daily.  T2DM (type 2 diabetes mellitus) ([place]) A1c improved while in the hospital.  We will continue current regimen of Metformin 1000 mg twice daily and Tresiba 22 units daily.  Also restart Invokana 300 mg daily.  He will be moving to [place] in the next week.  Advised him to establish care with new PCP to have his A1c checked soon.     Subjective:  HPI:  Summary of Hospital admission: Reason for admission: Pancreatitis Date of admission: 12/07/2019 Date of discharge: 12/09/2019 Date of Interactive contact: 12/13/2019 Summary of Hospital course: Patient presented to the ED on 12/09/2019 with acute pancreatitis after failing outpatient management.  He was admitted for conservative management.  Symptoms improved rapidly.  It was thought that his alcohol use was the main contributing factor to his pancreatitis.  He was restarted on all of his prior medications and discharged home  Interim history outlined by problem:   Pancreatitis Patient has done well since being discharged.  No recurrent abdominal pain.  No nausea or vomiting.  Is cut out alcohol completely.  He has been compliant with all his medications.  He reports he is taking 22 units of Tresiba daily despite not being on his medication list.  He is also taking Metformin 1000 mg twice daily.  He does not think that he is taking Invokana.  He is no  longer taking Trulicity.  He request refill medications today.  He will be moving to [place] next week.  ROS: Per HPI, otherwise a complete review of systems was negative.   PMH:  The following were reviewed and entered/updated in epic: Past Medical History:  Diagnosis Date  . Alcohol dependence ([place])   . Aortic atherosclerosis ([place]) 12/08/2019  . Diabetes mellitus without complication ([place])   . Hyperlipidemia   . Hypertension   . Pancreatitis 11/2019   Patient Active Problem List   Diagnosis Date Noted  . Aortic atherosclerosis ([employer]) 12/08/2019  . Pancreatitis 12/07/2019  . Alcohol dependence ([place]) 12/07/2019  . T2DM (type 2 diabetes mellitus) ([place]) 05/13/2018  . Hyperlipidemia associated with type 2 diabetes mellitus ([place]) 05/13/2018  . Former smoker - Candidate for yearly CT 04/27/2018  . Dyshydrosis 04/27/2018  . Erectile dysfunction 05/19/2016  . Vitamin D deficiency 12/29/2014  . Hypertension associated with diabetes ([place]) 12/29/2014   Past Surgical History:  Procedure Laterality Date  . APPENDECTOMY    . COLONOSCOPY N/A 02/22/2016   Procedure: COLONOSCOPY;  Surgeon: Danie Binder, MD;  Location: [facility];  Service: Endoscopy;  Laterality: N/A;  1100    Family History  Problem Relation Age of Onset  . Diabetes Mother   . Cancer Father        PROSTATE  . Colon cancer Neg Hx     Medications- Reconciled discharge and current medications in Epic.  Current Outpatient Medications  Medication Sig Dispense Refill  . acetaminophen (TYLENOL) 500 MG tablet  Take 1,000 mg by mouth every 6 (six) hours as needed for mild pain.    Marland Kitchen amLODipine (NORVASC) 5 MG tablet Take 1 tablet (5 mg total) by mouth daily. 90 tablet 3  . aspirin EC 81 MG tablet Take 81 mg by mouth daily.    Marland Kitchen atorvastatin (LIPITOR) 40 MG tablet Take 1 tablet (40 mg total) by mouth daily. 90 tablet 3  . Blood Glucose Monitoring Suppl (ACCU-CHEK GUIDE) w/Device KIT 1 kit by Does not apply route daily. Use  daily as needed to check blood sugar. 1 kit 0  . Continuous Blood Gluc Sensor (FREESTYLE LIBRE SENSOR SYSTEM) MISC Use 4 times daily as needed to check blood sugar. . 1 each 0  . glucose blood (ACCU-CHEK GUIDE) test strip Use twice daily as needed to check blood sugar. 100 each 12  . hydrochlorothiazide (HYDRODIURIL) 12.5 MG tablet Take 1 tablet (12.5 mg total) by mouth daily. 90 tablet 3  . Insulin Degludec 100 UNIT/ML SOLN Inject 22 Units into the skin daily.    . Insulin Pen Needle (PEN NEEDLES) 32G X 4 MM MISC 1 application by Does not apply route daily. Use daily to inject insulin. 100 each 0  . Lancets (ACCU-CHEK SAFE-T PRO) lancets Use twice daily as needed to check blood sugar 100 each 12  . metFORMIN (GLUCOPHAGE-XR) 500 MG 24 hr tablet Take 2 tablets (1,000 mg total) by mouth 2 (two) times daily. 360 tablet 3  . Multiple Vitamin (MULTIVITAMIN WITH MINERALS) TABS tablet Take 1 tablet by mouth daily. Centrum Silver    . Omega-3 Fatty Acids (FISH OIL) 1000 MG CPDR Take 1,000 mg by mouth daily.     . canagliflozin (INVOKANA) 300 MG TABS tablet Take 1 tablet (300 mg total) by mouth daily before breakfast. 90 tablet 3  . insulin degludec (TRESIBA FLEXTOUCH) 100 UNIT/ML FlexTouch Pen Inject 0.22 mLs (22 Units total) into the skin daily. 12 mL 3   No current facility-administered medications for this visit.    Allergies-reviewed and updated No Known Allergies  Social History   Socioeconomic History  . Marital status: Single    Spouse name: Not on file  . Number of children: Not on file  . Years of education: Not on file  . Highest education level: Not on file  Occupational History  . Occupation: Oncologist  Tobacco Use  . Smoking status: Former Smoker    Quit date: 02/21/2011    Years since quitting: 8.8  . Smokeless tobacco: Former Systems developer    Types: Chew  Substance and Sexual Activity  . Alcohol use: Yes    Alcohol/week: 0.0 standard drinks    Comment: a pint of scotch most  days  . Drug use: Yes    Types: Marijuana    Comment: "about a month ago"  . Sexual activity: Not on file  Other Topics Concern  . Not on file  Social History Narrative  . Not on file   Social Determinants of Health   Financial Resource Strain:   . Difficulty of Paying Living Expenses:   Food Insecurity:   . Worried About Charity fundraiser in the Last Year:   . Arboriculturist in the Last Year:   Transportation Needs:   . Film/video editor (Medical):   Marland Kitchen Lack of Transportation (Non-Medical):   Physical Activity:   . Days of Exercise per Week:   . Minutes of Exercise per Session:   Stress:   . Feeling of  Stress :   Social Connections:   . Frequency of Communication with Friends and Family:   . Frequency of Social Gatherings with Friends and Family:   . Attends Religious Services:   . Active Member of Clubs or Organizations:   . Attends Archivist Meetings:   Marland Kitchen Marital Status:         Objective:  Physical Exam: BP 138/68   Pulse 95   Temp 98.2 F (36.8 C)   Ht '5\' 6"'$  (1.676 m)   Wt 205 lb 8 oz (93.2 kg)   SpO2 97%   BMI 33.17 kg/m   Gen: NAD, resting comfortably CV: RRR with no murmurs appreciated Pulm: NWOB, CTAB with no crackles, wheezes, or rhonchi GI: Normal bowel sounds present. Soft, Nontender, Nondistended. MSK: No edema, cyanosis, or clubbing noted Skin: Warm, dry Neuro: Grossly normal, moves all extremities Psych: Normal affect and thought content      Truc Winfree M. Jerline Pain, MD 12/19/2019 2:47 PM

## 2019-12-19 NOTE — Assessment & Plan Note (Signed)
Stable.  Continue atorvastatin 40 mg daily. ° °

## 2019-12-19 NOTE — Assessment & Plan Note (Signed)
A1c improved while in the hospital.  We will continue current regimen of Metformin 1000 mg twice daily and Tresiba 22 units daily.  Also restart Invokana 300 mg daily.  He will be moving to [place] in the next week.  Advised him to establish care with new PCP to have his A1c checked soon.

## 2019-12-29 ENCOUNTER — Ambulatory Visit: Payer: BC Managed Care – PPO | Admitting: Family Medicine

## 2021-02-12 IMAGING — CT CT ABD-PELV W/ CM
2 of 5 series · 16 of 46 positions shown, 18 images · IV contrast (Omni 300)
Comparison: 12/03/2019

CLINICAL DATA: Pancreatitis, abdominal pain

EXAM:
CT ABDOMEN AND PELVIS WITH CONTRAST
TECHNIQUE: Multidetector CT imaging of the abdomen and pelvis was performed
using the standard protocol following bolus administration of
intravenous contrast.
CONTRAST:  100mL OMNIPAQUE IOHEXOL 300 MG/ML  SOLN

[Series 3: a/p w/ 5mm · axial · 0.73mm/px · z∈[+585,+1015]mm · 13 of 98 slices shown, 15 images]
[im 6/98  soft-tissue]
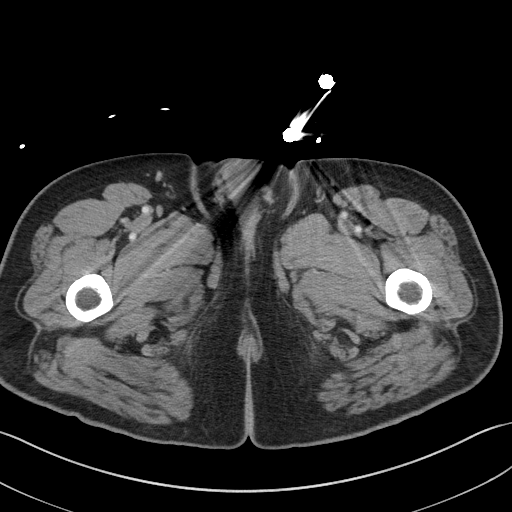
[im 6/98  bone]
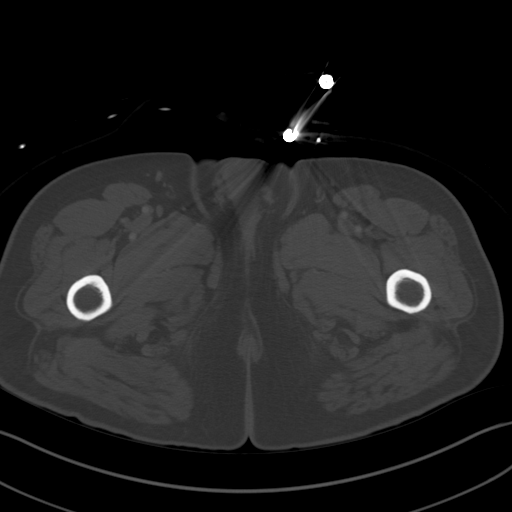
[im 11/98  soft-tissue]
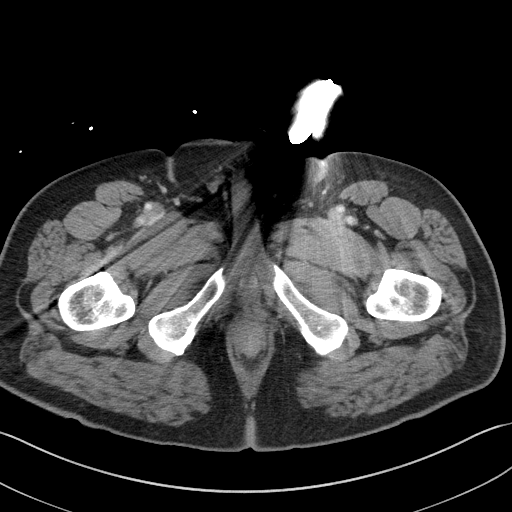
[im 22/98  soft-tissue]
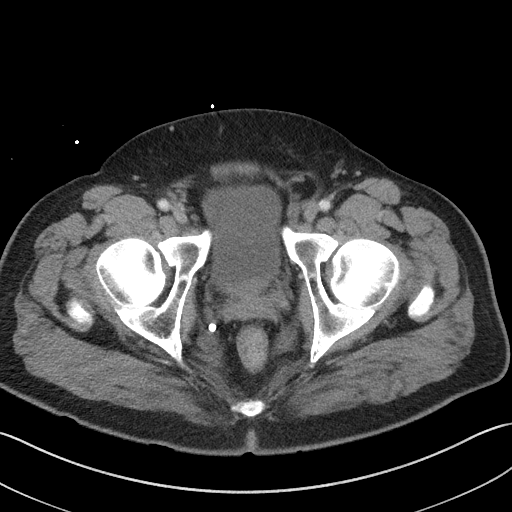
[im 27/98  soft-tissue]
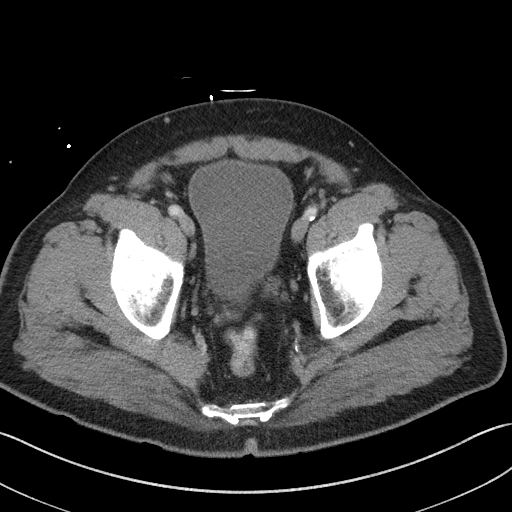
[im 33/98  soft-tissue]
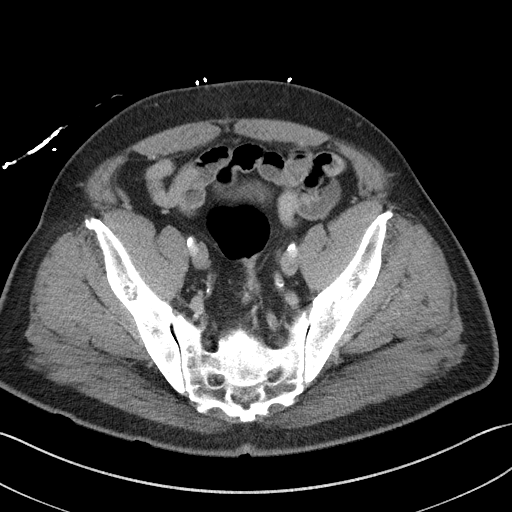
[im 44/98  soft-tissue]
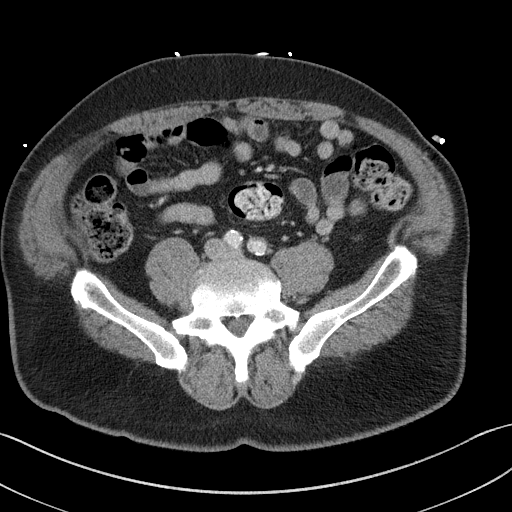
[im 49/98  soft-tissue]
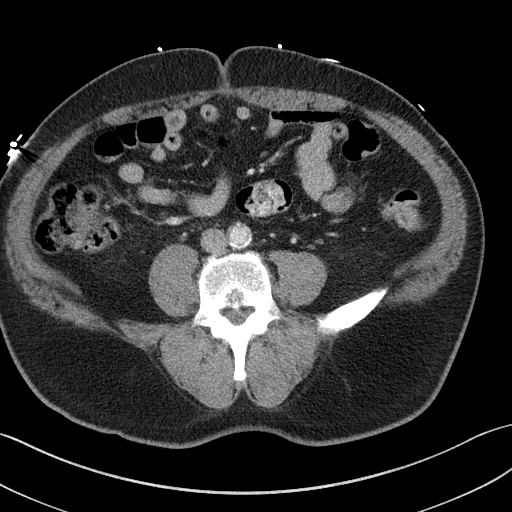
[im 54/98  soft-tissue]
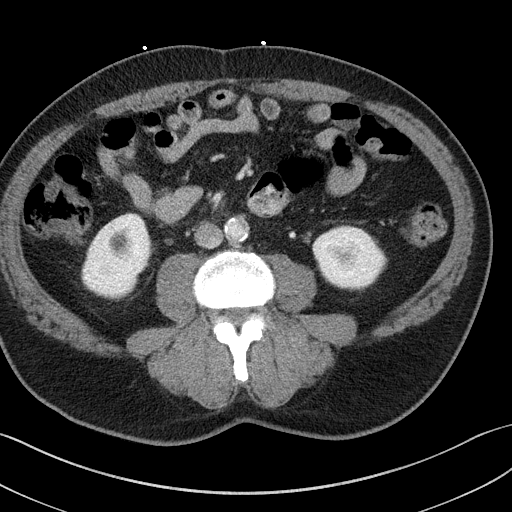
[im 65/98  soft-tissue]
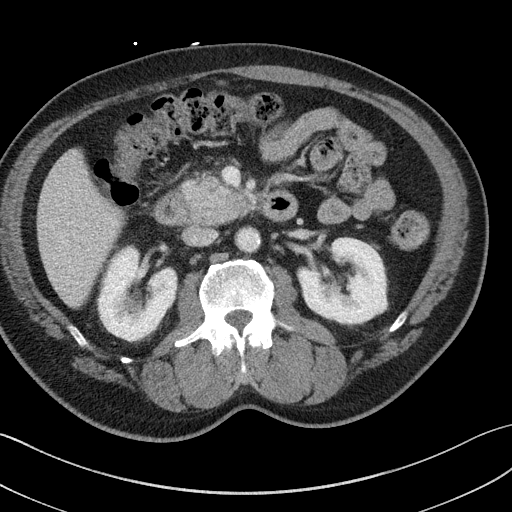
[im 65/98  bone]
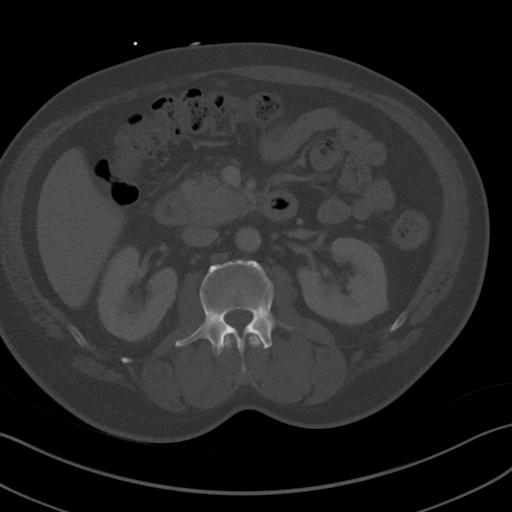
[im 71/98  soft-tissue]
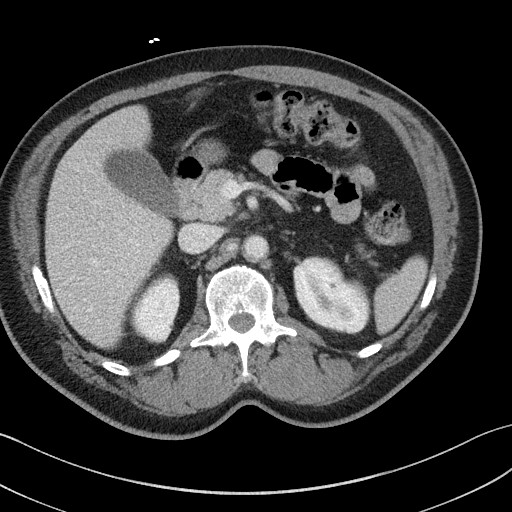
[im 76/98  soft-tissue]
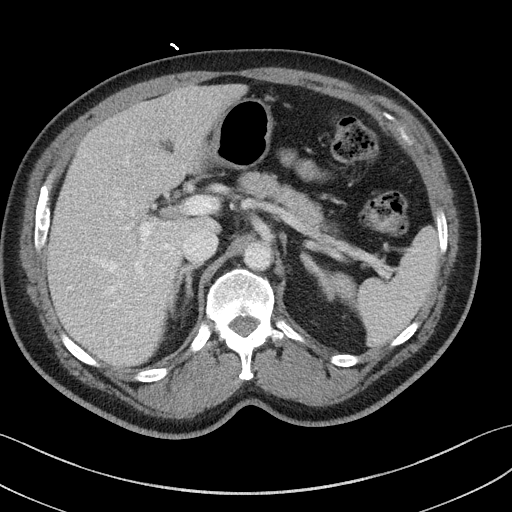
[im 87/98  soft-tissue]
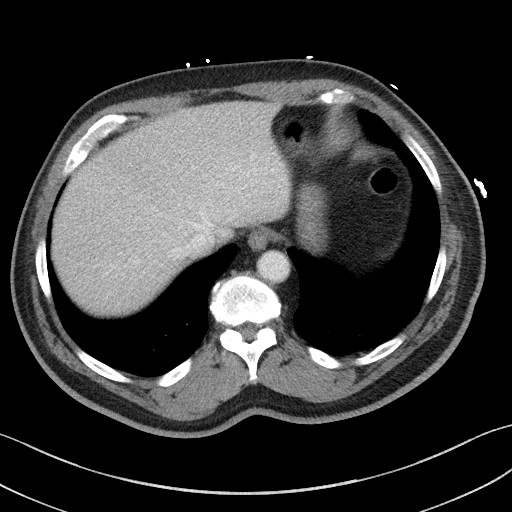
[im 92/98  soft-tissue]
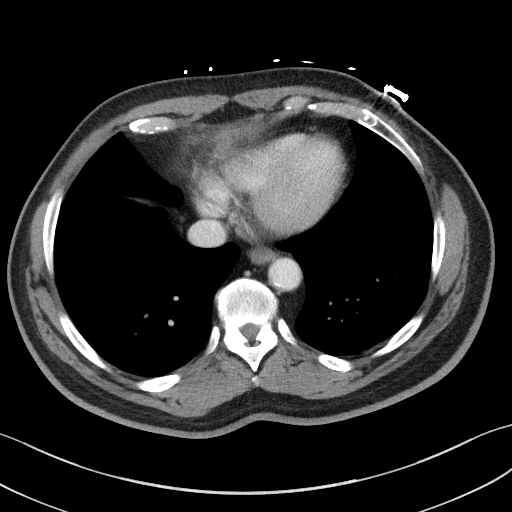

[Series 6: a/p w/ cor · coronal · 0.80mm/px · 3 of 156 slices shown]
[im 52/156  soft-tissue]
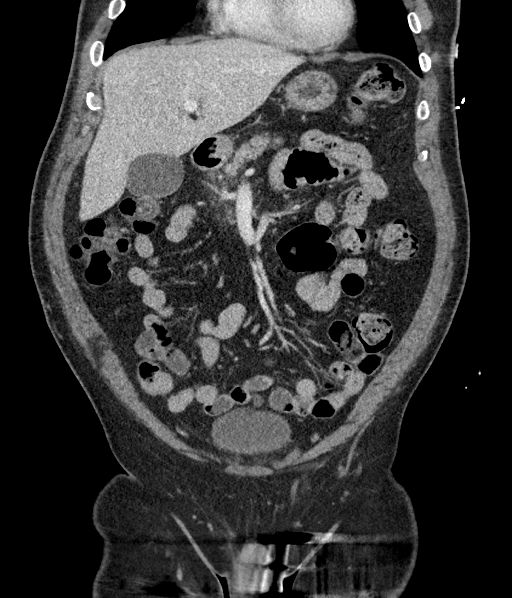
[im 69/156  soft-tissue]
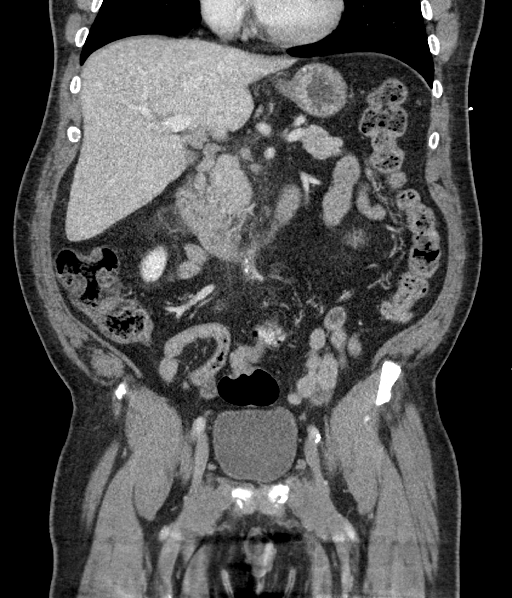
[im 87/156  soft-tissue]
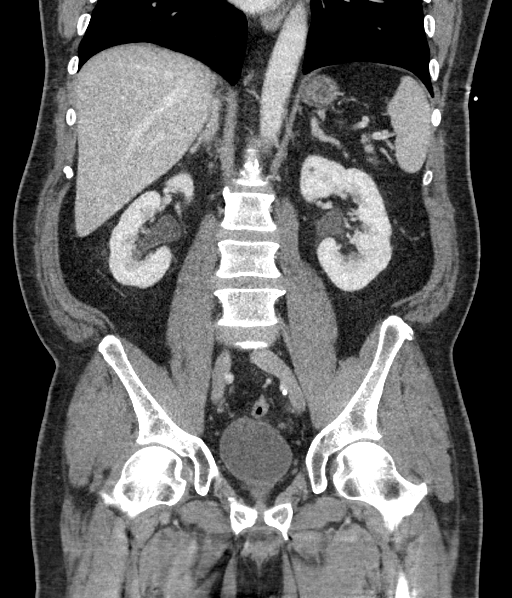

[16 of 46 positions shown; findings below may reference images not displayed]

FINDINGS: Lower chest: No acute abnormality.

Hepatobiliary: Stable 1.4 cm cyst at the right hepatic dome. Liver
is otherwise unremarkable in appearance. No new lesion. Gallbladder
is unremarkable without gallstone. No biliary dilatation.

Pancreas: Mild enlargement of the pancreatic head with
peripancreatic fat stranding, subtly decreased in comparison to
prior. Pancreatic parenchyma enhances homogeneously without evidence
of necrosis. No organized fluid collection.

Spleen: Normal in size without focal abnormality.

Adrenals/Urinary Tract: Unremarkable adrenal glands. Stable small
right renal cysts. Bilateral extrarenal pelvises. No hydronephrosis.
Urinary bladder unremarkable.

Stomach/Bowel: Stomach is within normal limits. Appendix not
visualized. No evidence of bowel wall thickening, distention, or
inflammatory changes.

Vascular/Lymphatic: Moderate calcified and noncalcified
atherosclerotic plaque throughout the aortoiliac axis. No aneurysm.
No abdominopelvic lymphadenopathy.

Reproductive: Prostate is unremarkable.

Other: Very small fat containing left inguinal hernia. No ascites or
pneumoperitoneum.

Musculoskeletal: No acute or significant osseous findings.
IMPRESSION: Findings consistent with acute pancreatitis involving the pancreatic
head. No evidence of pancreatic necrosis or organized fluid
collection. The degree of peripancreatic inflammatory changes has
slightly decreased compared to recent prior examination 12/03/2019.

Aortic Atherosclerosis ([id number]-4M0.0).

## 2021-02-12 IMAGING — US US ABDOMEN LIMITED
1 series · 14 of 25 positions shown · non-contrast
Comparison: CT scan of the abdomen and pelvis dated 12/07/2019

CLINICAL DATA: Pancreatitis.

EXAM:
ULTRASOUND ABDOMEN LIMITED RIGHT UPPER QUADRANT

[Series 1: us abdomen limited ruq · 14 of 44 slices shown]
[im 1/44]
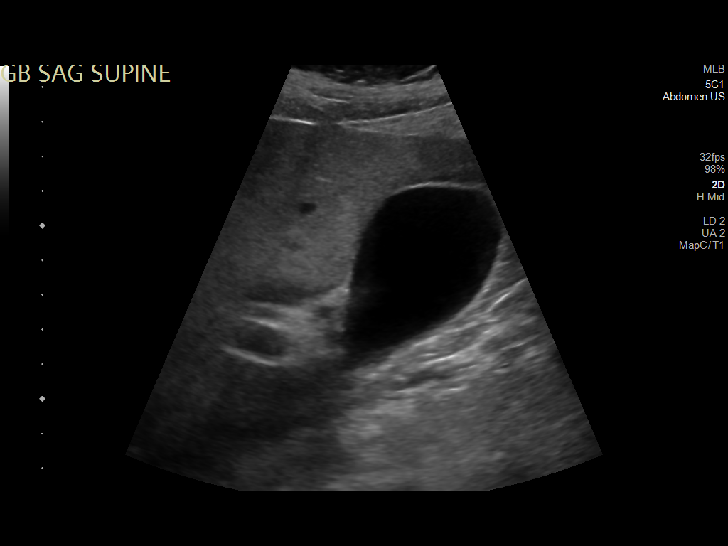
[im 4/44]
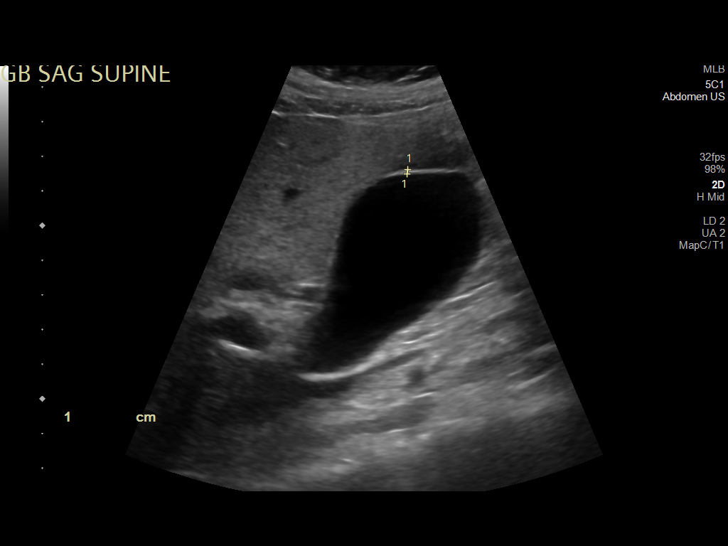
[im 8/44]
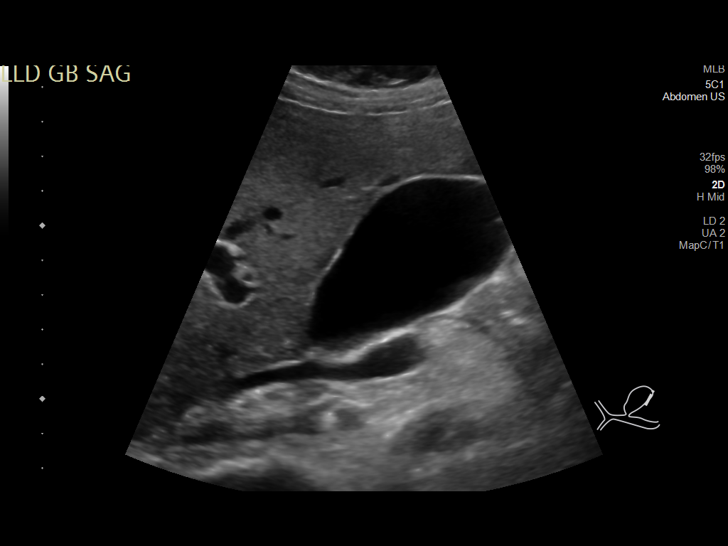
[im 11/44]
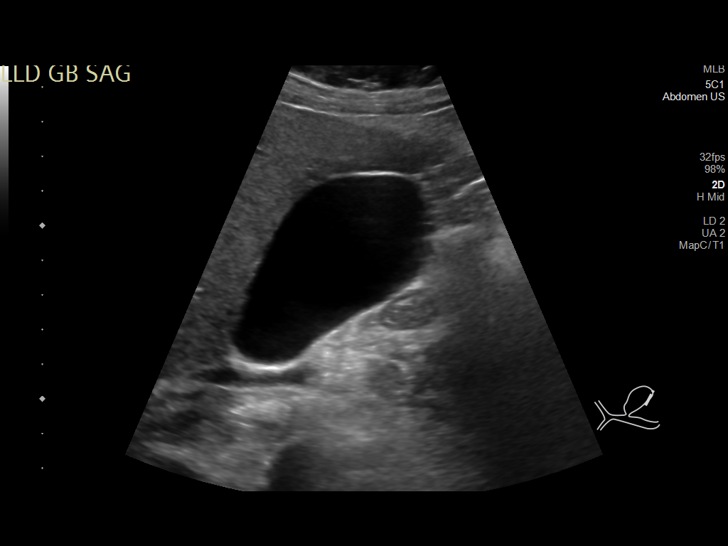
[im 15/44]
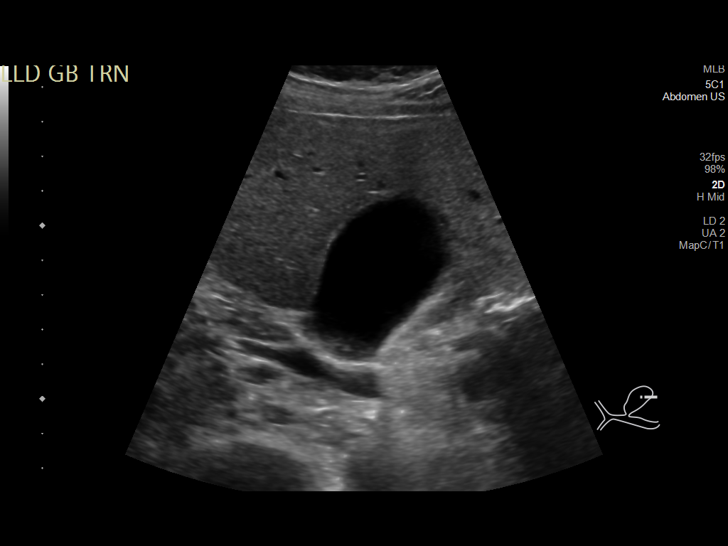
[im 17/44]
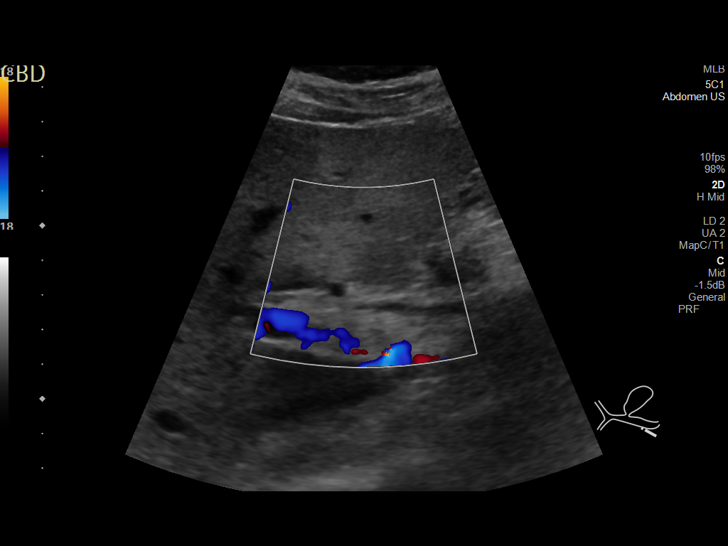
[im 20/44]
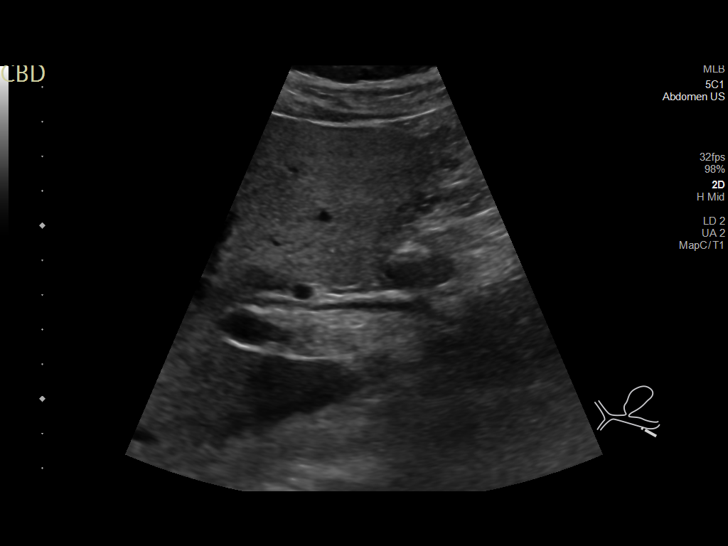
[im 24/44]
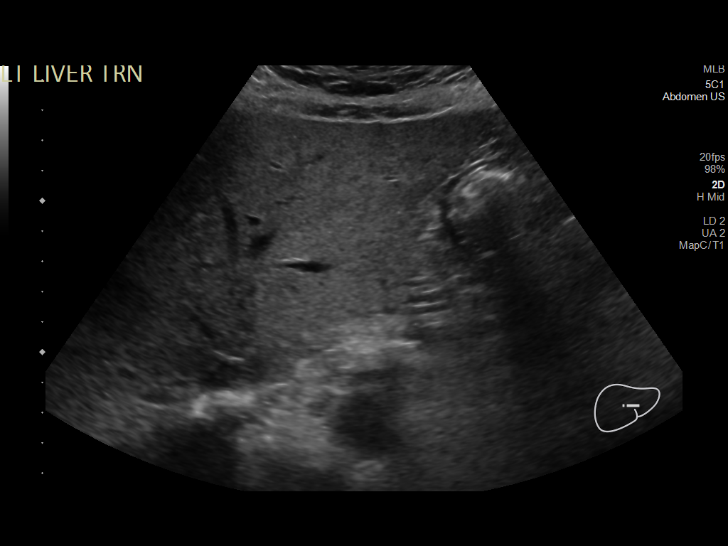
[im 27/44]
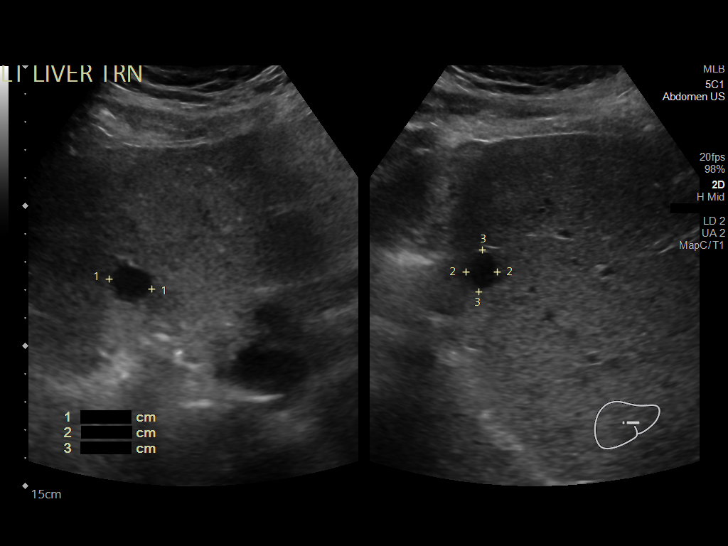
[im 29/44]
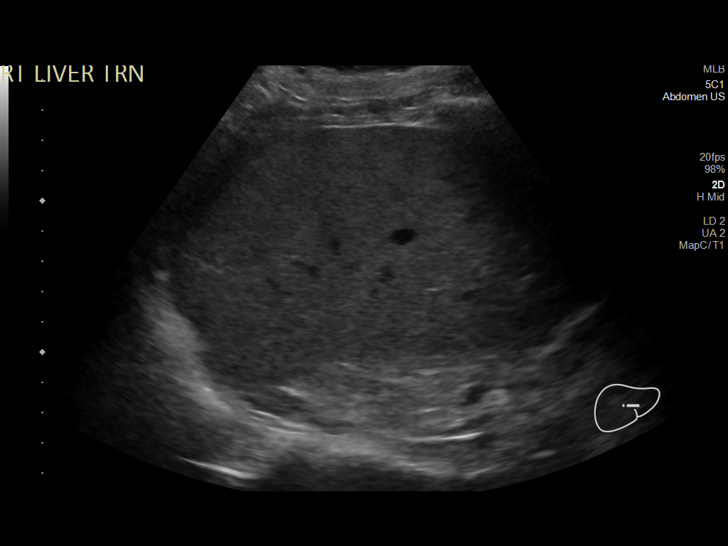
[im 33/44]
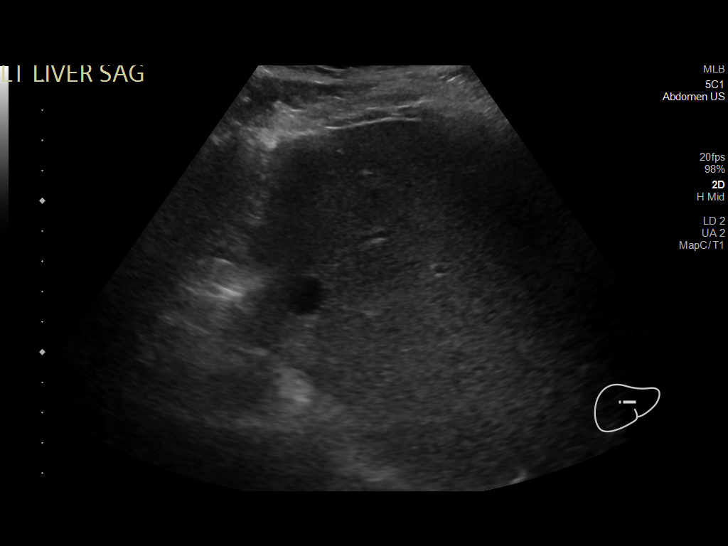
[im 36/44]
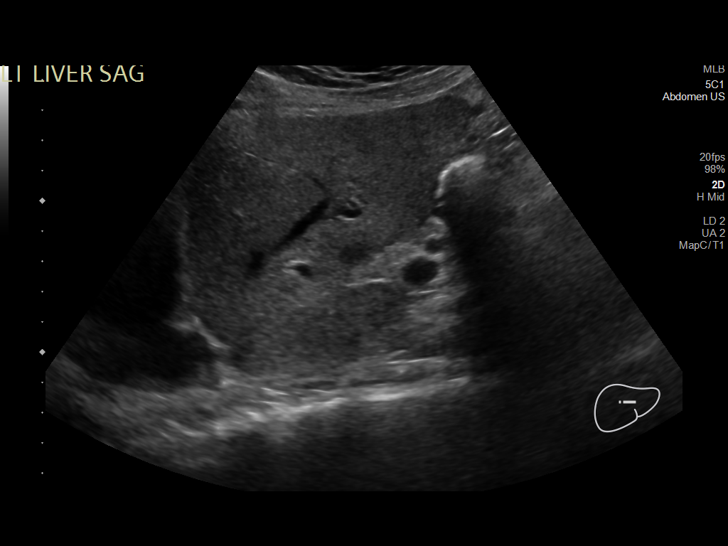
[im 40/44]
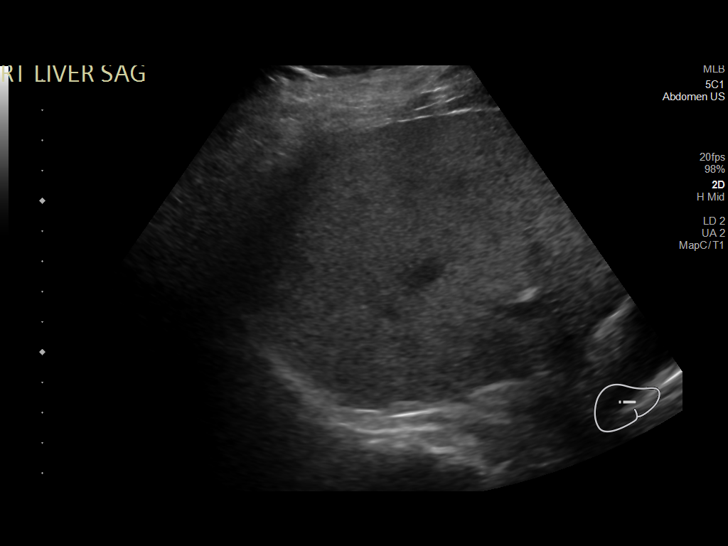
[im 44/44]
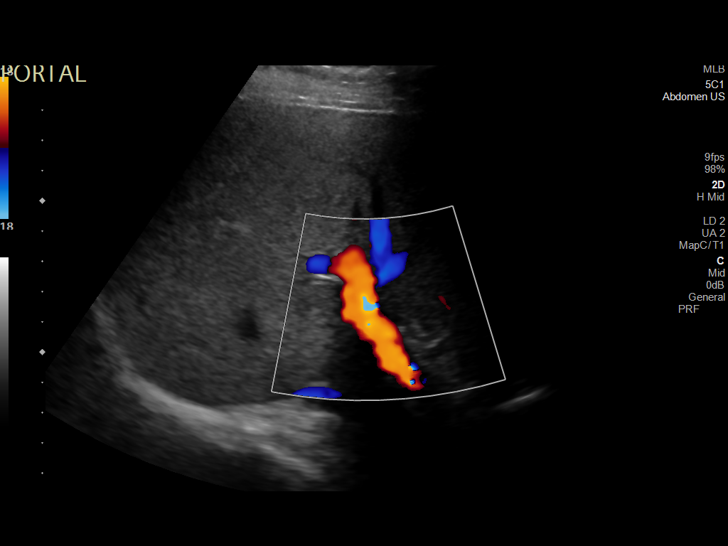

[14 of 25 positions shown; findings below may reference images not displayed]

FINDINGS: Gallbladder:

No gallstones or wall thickening visualized. No sonographic Murphy
sign noted by sonographer.

Common bile duct:

Diameter: 3.1 mm, normal.

Liver:

1.6 cm cyst in the left lobe of the liver. No other focal liver
lesions. Within normal limits in parenchymal echogenicity. Portal
vein is patent on color Doppler imaging with normal direction of
blood flow towards the liver.

Other: None.
IMPRESSION: No significant abnormalities.
